# Patient Record
Sex: Female | Born: 1965 | Race: White | Hispanic: No | Smoking: Never smoker
Health system: Southern US, Community
[De-identification: ages and names within clinical notes are randomized; demographics above are authoritative.]

## PROBLEM LIST (undated history)

## (undated) DIAGNOSIS — F32A Depression, unspecified: Secondary | ICD-10-CM

## (undated) DIAGNOSIS — IMO0002 Reserved for concepts with insufficient information to code with codable children: Secondary | ICD-10-CM

## (undated) DIAGNOSIS — F419 Anxiety disorder, unspecified: Secondary | ICD-10-CM

## (undated) DIAGNOSIS — R87619 Unspecified abnormal cytological findings in specimens from cervix uteri: Secondary | ICD-10-CM

## (undated) DIAGNOSIS — F329 Major depressive disorder, single episode, unspecified: Secondary | ICD-10-CM

## (undated) DIAGNOSIS — O09529 Supervision of elderly multigravida, unspecified trimester: Secondary | ICD-10-CM

## (undated) HISTORY — DX: Anxiety disorder, unspecified: F41.9

## (undated) HISTORY — DX: Depression, unspecified: F32.A

## (undated) HISTORY — PX: DILATION AND CURETTAGE OF UTERUS: SHX78

## (undated) HISTORY — PX: COLPOSCOPY: SHX161

## (undated) HISTORY — DX: Reserved for concepts with insufficient information to code with codable children: IMO0002

## (undated) HISTORY — DX: Unspecified abnormal cytological findings in specimens from cervix uteri: R87.619

## (undated) HISTORY — DX: Major depressive disorder, single episode, unspecified: F32.9

## (undated) HISTORY — DX: Supervision of elderly multigravida, unspecified trimester: O09.529

---

## 1998-04-05 ENCOUNTER — Ambulatory Visit (HOSPITAL_COMMUNITY): Admission: AD | Admit: 1998-04-05 | Discharge: 1998-04-05 | Payer: Self-pay | Admitting: Obstetrics and Gynecology

## 1998-04-05 ENCOUNTER — Encounter: Payer: Self-pay | Admitting: Obstetrics and Gynecology

## 1999-01-15 ENCOUNTER — Other Ambulatory Visit: Admission: RE | Admit: 1999-01-15 | Discharge: 1999-01-15 | Payer: Self-pay | Admitting: *Deleted

## 1999-05-30 ENCOUNTER — Inpatient Hospital Stay (HOSPITAL_COMMUNITY): Admission: AD | Admit: 1999-05-30 | Discharge: 1999-05-30 | Payer: Self-pay | Admitting: Obstetrics and Gynecology

## 1999-08-06 ENCOUNTER — Inpatient Hospital Stay (HOSPITAL_COMMUNITY): Admission: AD | Admit: 1999-08-06 | Discharge: 1999-08-09 | Payer: Self-pay | Admitting: Pediatrics

## 1999-09-18 ENCOUNTER — Other Ambulatory Visit: Admission: RE | Admit: 1999-09-18 | Discharge: 1999-09-18 | Payer: Self-pay | Admitting: Obstetrics and Gynecology

## 2000-03-24 ENCOUNTER — Other Ambulatory Visit: Admission: RE | Admit: 2000-03-24 | Discharge: 2000-03-24 | Payer: Self-pay | Admitting: Obstetrics and Gynecology

## 2000-09-23 ENCOUNTER — Other Ambulatory Visit: Admission: RE | Admit: 2000-09-23 | Discharge: 2000-09-23 | Payer: Self-pay | Admitting: Obstetrics and Gynecology

## 2002-09-12 ENCOUNTER — Other Ambulatory Visit: Admission: RE | Admit: 2002-09-12 | Discharge: 2002-09-12 | Payer: Self-pay | Admitting: Obstetrics and Gynecology

## 2004-04-23 ENCOUNTER — Other Ambulatory Visit: Admission: RE | Admit: 2004-04-23 | Discharge: 2004-04-23 | Payer: Self-pay | Admitting: Obstetrics and Gynecology

## 2005-07-12 ENCOUNTER — Other Ambulatory Visit: Admission: RE | Admit: 2005-07-12 | Discharge: 2005-07-12 | Payer: Self-pay | Admitting: Obstetrics and Gynecology

## 2006-08-16 HISTORY — PX: BREAST SURGERY: SHX581

## 2009-08-24 ENCOUNTER — Emergency Department (HOSPITAL_BASED_OUTPATIENT_CLINIC_OR_DEPARTMENT_OTHER): Admission: EM | Admit: 2009-08-24 | Discharge: 2009-08-24 | Payer: Self-pay | Admitting: Emergency Medicine

## 2010-01-20 ENCOUNTER — Ambulatory Visit: Payer: Self-pay | Admitting: Oncology

## 2010-01-21 LAB — CBC WITH DIFFERENTIAL/PLATELET
BASO%: 0.4 % (ref 0.0–2.0)
Basophils Absolute: 0 10*3/uL (ref 0.0–0.1)
EOS%: 0.5 % (ref 0.0–7.0)
Eosinophils Absolute: 0 10*3/uL (ref 0.0–0.5)
HCT: 37 % (ref 34.8–46.6)
HGB: 13.1 g/dL (ref 11.6–15.9)
LYMPH%: 14.1 % (ref 14.0–49.7)
MCH: 32.4 pg (ref 25.1–34.0)
MCHC: 35.3 g/dL (ref 31.5–36.0)
MCV: 91.8 fL (ref 79.5–101.0)
MONO#: 0.4 10*3/uL (ref 0.1–0.9)
MONO%: 6 % (ref 0.0–14.0)
NEUT#: 5 10*3/uL (ref 1.5–6.5)
NEUT%: 79 % — ABNORMAL HIGH (ref 38.4–76.8)
Platelets: 223 10*3/uL (ref 145–400)
RBC: 4.03 10*6/uL (ref 3.70–5.45)
RDW: 12.1 % (ref 11.2–14.5)
WBC: 6.4 10*3/uL (ref 3.9–10.3)
lymph#: 0.9 10*3/uL (ref 0.9–3.3)

## 2010-01-26 LAB — HYPERCOAGULABLE PANEL, COMPREHENSIVE
AntiThromb III Func: 112 % (ref 76–126)
Anticardiolipin IgA: 9 APL U/mL (ref <22)
Anticardiolipin IgG: 10 GPL U/mL (ref <23)
Anticardiolipin IgM: 5 MPL U/mL (ref <11)
Beta-2 Glyco I IgG: 0 G Units (ref <20)
Beta-2-Glycoprotein I IgA: 8 A Units (ref <20)
Beta-2-Glycoprotein I IgM: 18 M Units (ref <20)
DRVVT: 38 secs (ref 36.2–44.3)
Homocysteine: 8 umol/L (ref 4.0–15.4)
Lupus Anticoagulant: NOT DETECTED
PTT Lupus Anticoagulant: 45.1 secs — ABNORMAL HIGH (ref 32.0–43.4)
PTTLA 4:1 Mix: 39.9 secs (ref 36.3–48.8)
Protein C Activity: 100 % (ref 75–133)
Protein C, Total: 68 % — ABNORMAL LOW (ref 70–140)
Protein S Activity: 102 % (ref 69–129)
Protein S Ag, Total: 95 % (ref 70–140)

## 2010-01-26 LAB — ANA: Anti Nuclear Antibody(ANA): NEGATIVE

## 2010-01-26 LAB — COMPREHENSIVE METABOLIC PANEL
ALT: 15 U/L (ref 0–35)
AST: 17 U/L (ref 0–37)
Albumin: 4.7 g/dL (ref 3.5–5.2)
Alkaline Phosphatase: 32 U/L — ABNORMAL LOW (ref 39–117)
BUN: 16 mg/dL (ref 6–23)
CO2: 25 mEq/L (ref 19–32)
Calcium: 9.5 mg/dL (ref 8.4–10.5)
Chloride: 102 mEq/L (ref 96–112)
Creatinine, Ser: 0.72 mg/dL (ref 0.40–1.20)
Glucose, Bld: 90 mg/dL (ref 70–99)
Potassium: 3.8 mEq/L (ref 3.5–5.3)
Sodium: 138 mEq/L (ref 135–145)
Total Bilirubin: 0.7 mg/dL (ref 0.3–1.2)
Total Protein: 7.7 g/dL (ref 6.0–8.3)

## 2010-01-26 LAB — SEDIMENTATION RATE: Sed Rate: 14 mm/hr (ref 0–22)

## 2010-02-08 ENCOUNTER — Ambulatory Visit: Payer: Self-pay | Admitting: Nurse Practitioner

## 2010-02-08 ENCOUNTER — Inpatient Hospital Stay (HOSPITAL_COMMUNITY): Admission: AD | Admit: 2010-02-08 | Discharge: 2010-02-08 | Payer: Self-pay | Admitting: Obstetrics and Gynecology

## 2011-03-09 IMAGING — US US OB TRANSVAGINAL MODIFY
1 series · 14 of 28 positions shown · non-contrast
Comparison: Head

CLINICAL DATA: Vaginal bleeding and cramping.

OBSTETRIC <14 WK US AND TRANSVAGINAL OB US
TECHNIQUE: Both transabdominal and transvaginal ultrasound
examinations were performed for complete evaluation of the
gestation as well as the maternal uterus, adnexal regions, and
pelvic cul-de-sac.  Transvaginal technique was performed to assess
early pregnancy.

[Series 1: us ob comp less 14 wks · 0.21mm/px · 14 of 38 slices shown]
[im 2/38]
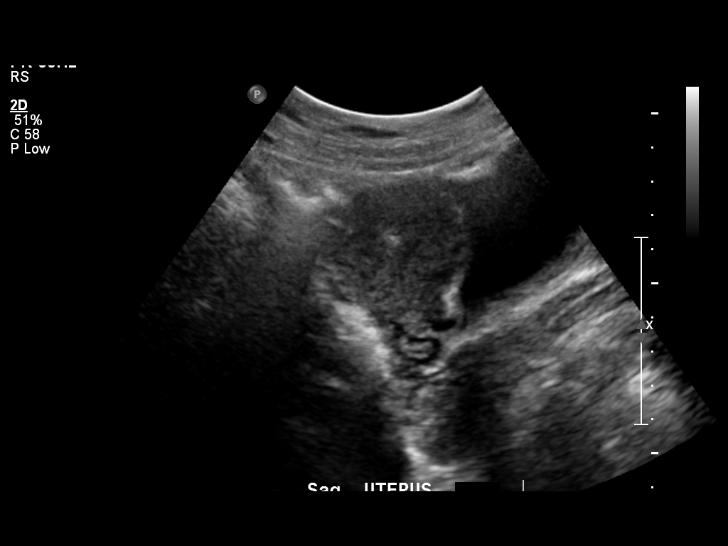
[im 5/38]
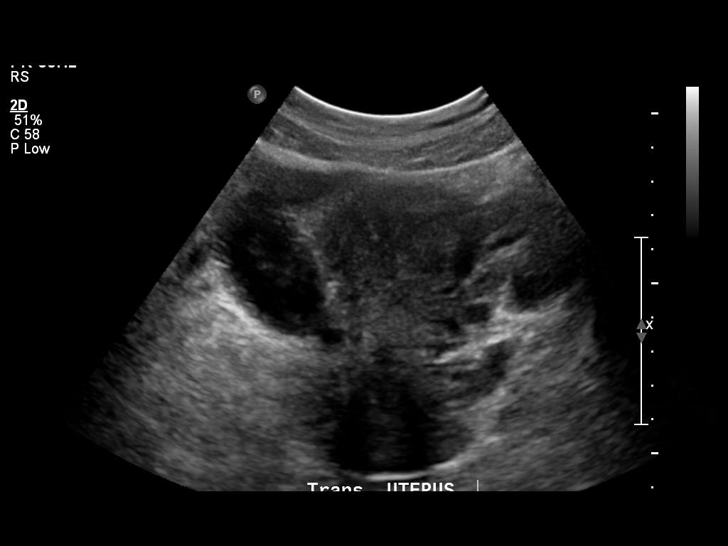
[im 7/38]
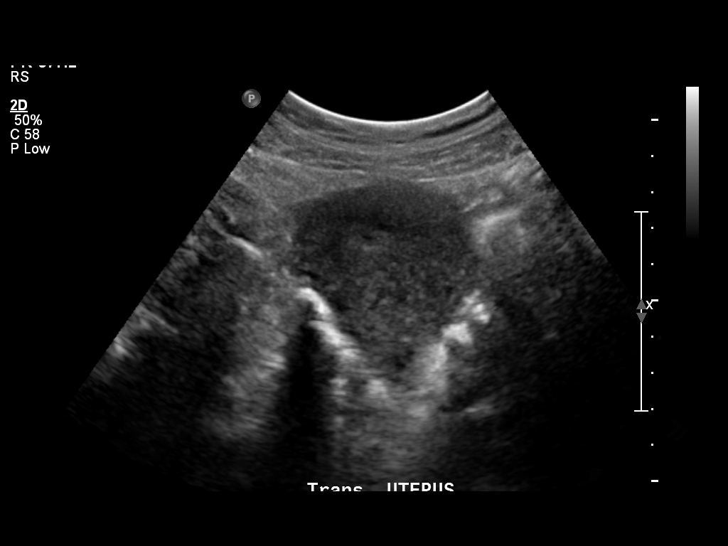
[im 10/38]
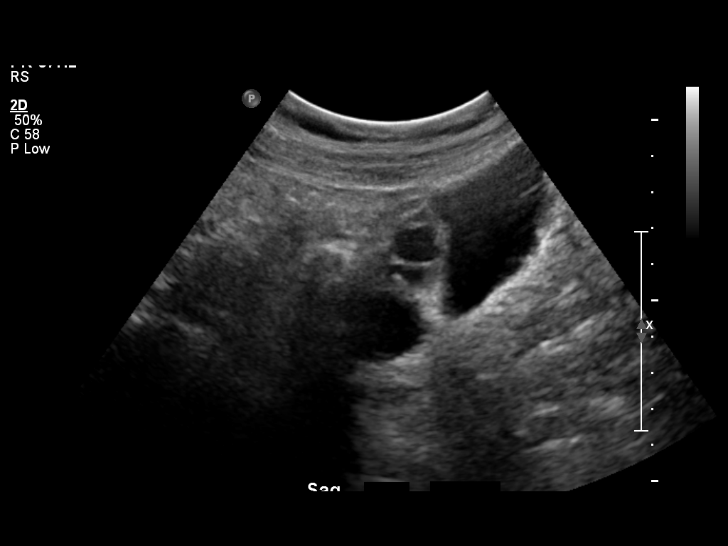
[im 13/38]
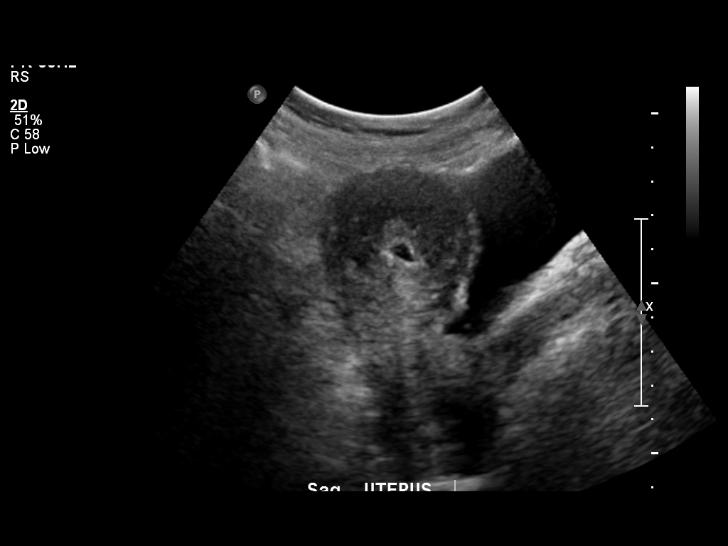
[im 16/38]
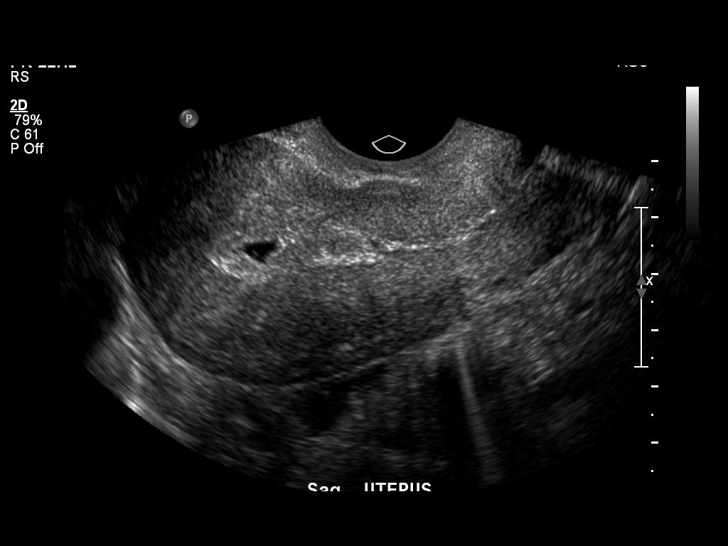
[im 18/38]
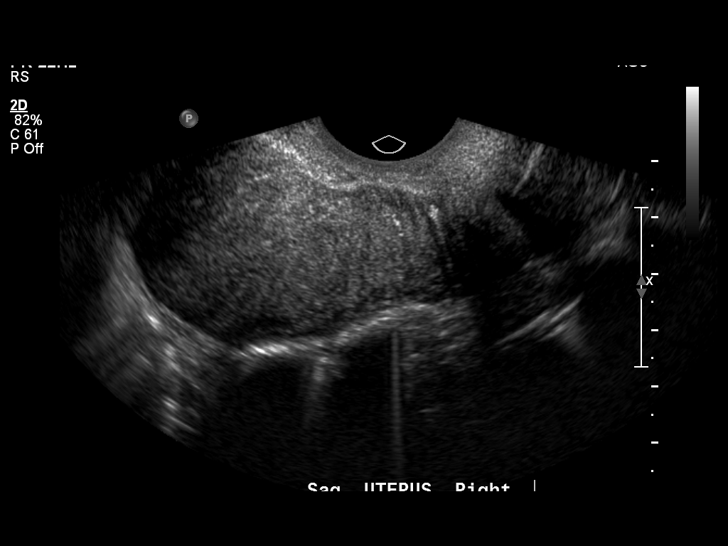
[im 21/38]
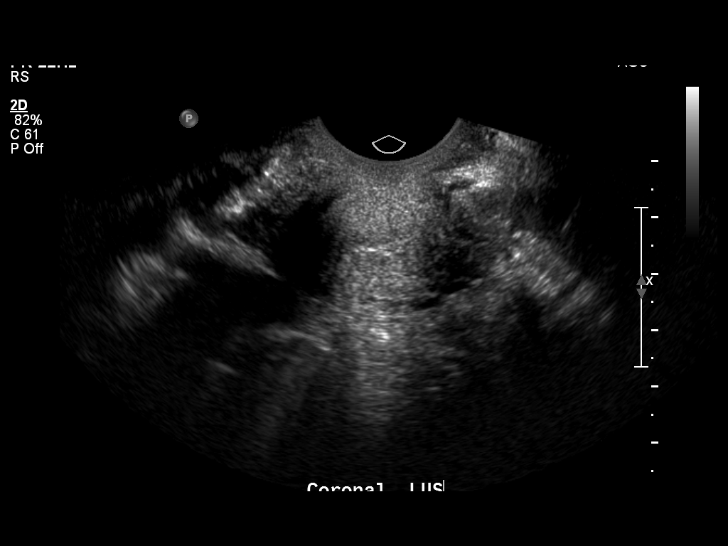
[im 24/38]
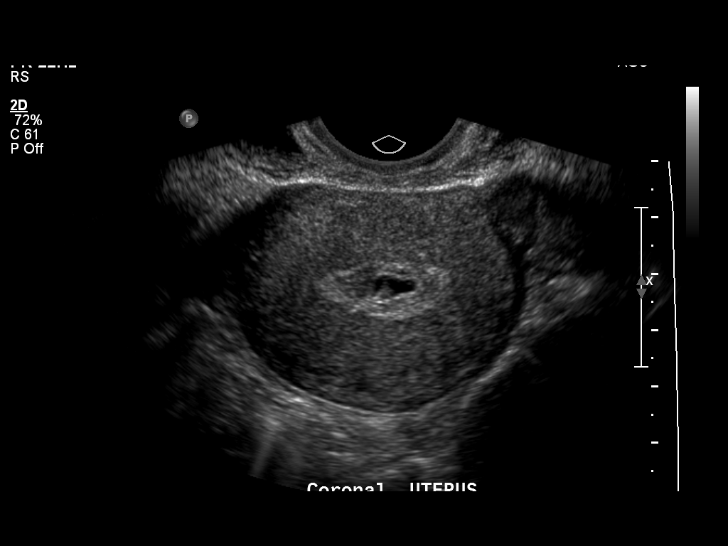
[im 27/38]
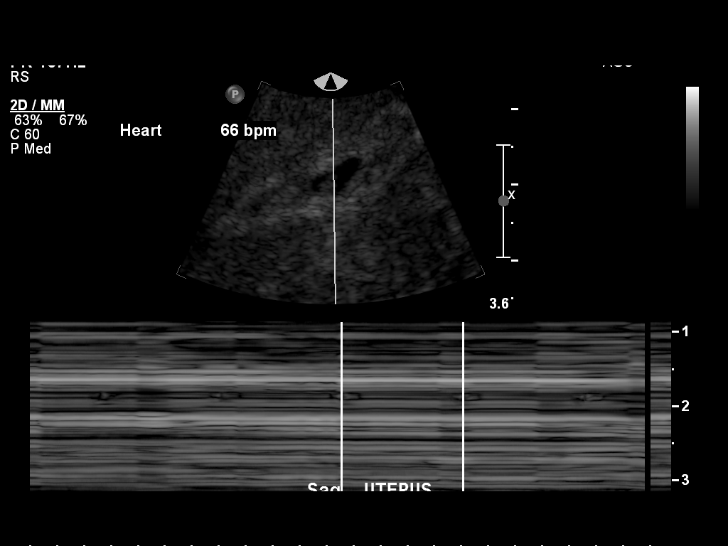
[im 29/38]
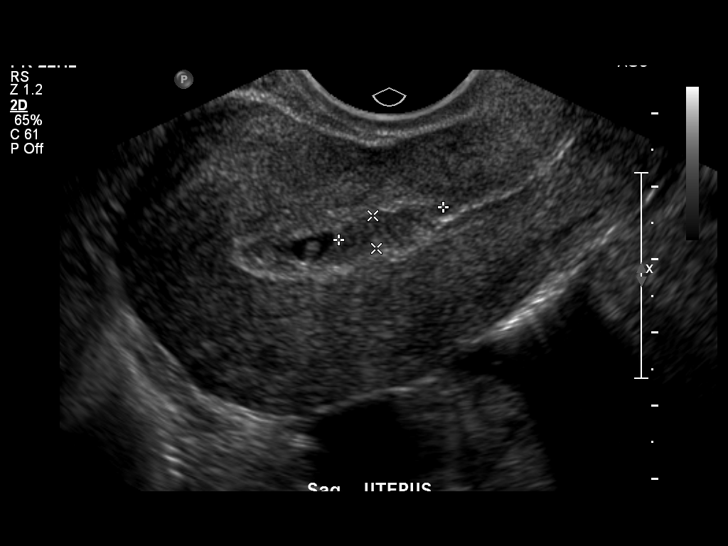
[im 32/38]
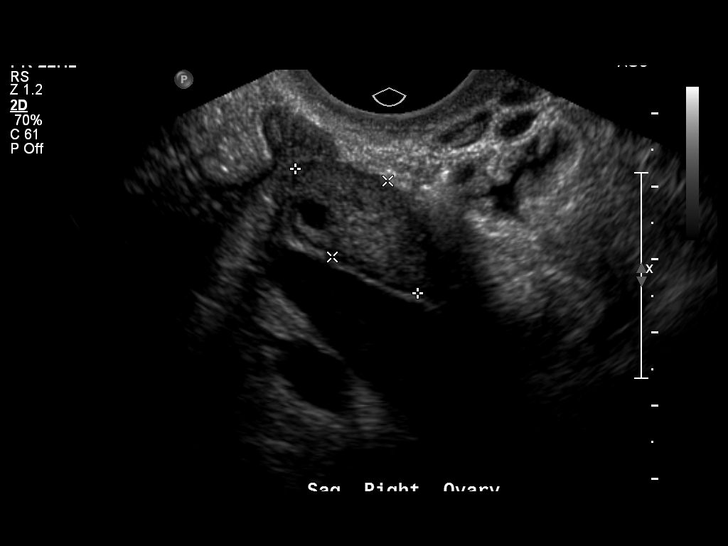
[im 35/38]
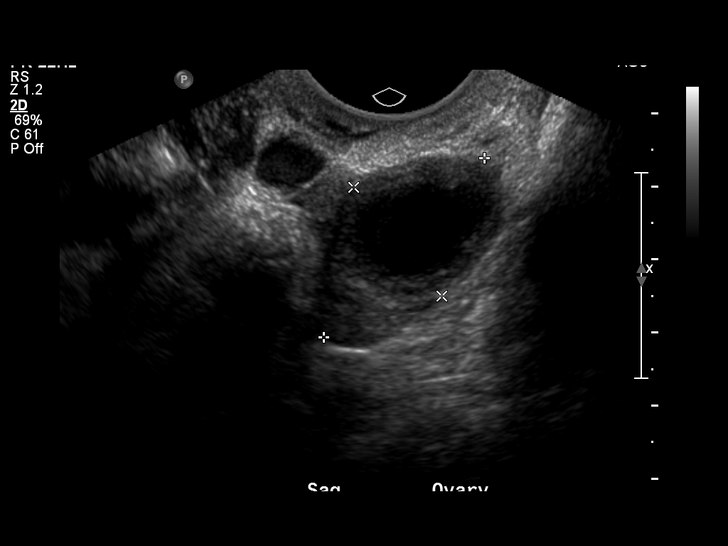
[im 38/38]
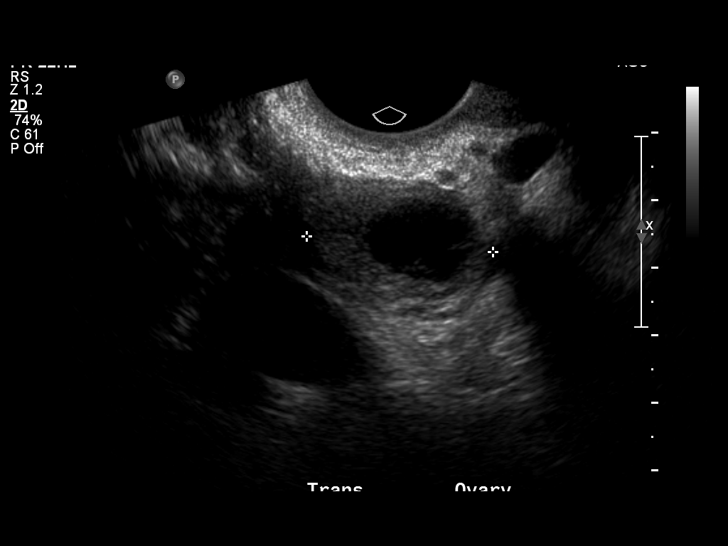

[14 of 28 positions shown; findings below may reference images not displayed]

Intrauterine gestational sac:  There is a single irregular mobile
gestational sac with no decidual reaction.
Yolk sac: No
Embryo: Yes
Cardiac Activity: Yes
Heart Rate: 66 bpm

CRL: 3.1 and   mm  6   w  0   d

Maternal uterus/adnexae:
The right ovary is normal.  There is a corpus lutein cyst on the
otherwise normal left ovary.

There is no free fluid.
IMPRESSION: Single intrauterine pregnancy of approximately 6 weeks 0 days
gestation.  Deformed gestational sac with no significant decidual
reaction.

## 2011-05-07 ENCOUNTER — Other Ambulatory Visit (HOSPITAL_COMMUNITY): Payer: Self-pay | Admitting: Gynecology

## 2011-05-07 DIAGNOSIS — N979 Female infertility, unspecified: Secondary | ICD-10-CM

## 2011-05-13 ENCOUNTER — Ambulatory Visit (HOSPITAL_COMMUNITY)
Admission: RE | Admit: 2011-05-13 | Discharge: 2011-05-13 | Disposition: A | Discharge status: Home / Self Care | Payer: BC Managed Care – PPO | Source: Ambulatory Visit | Attending: Gynecology | Admitting: Gynecology

## 2011-05-13 DIAGNOSIS — N979 Female infertility, unspecified: Secondary | ICD-10-CM

## 2011-05-13 MED ORDER — IOHEXOL 300 MG/ML  SOLN: 10.0000 mL | Freq: Once | INTRAMUSCULAR | Status: AC | PRN

## 2012-08-16 NOTE — L&D Delivery Note (Signed)
Delivery Note At 9:15 PM a viable female was delivered via Vaginal, Spontaneous Delivery (Presentation:OA ;  ).  APGAR: , ; weight .   Placenta status:spont>>>intact   , .  Cord:  with the following complications: .  Cord pH: sent  Anesthesia:  epid Episiotomy: none Lacerations: first deg Suture Repair: 3.0 vicryl rapide Est. Blood Loss (mL): 300  Mom to postpartum.  Baby to nursery-stable.  Meriel Pica 04/10/2013, 9:28 PM

## 2012-09-13 LAB — OB RESULTS CONSOLE ANTIBODY SCREEN: Antibody Screen: NEGATIVE

## 2012-09-13 LAB — OB RESULTS CONSOLE GC/CHLAMYDIA
Chlamydia: NEGATIVE
Gonorrhea: NEGATIVE

## 2012-09-13 LAB — OB RESULTS CONSOLE RUBELLA ANTIBODY, IGM: Rubella: IMMUNE

## 2012-09-13 LAB — OB RESULTS CONSOLE RPR: RPR: NONREACTIVE

## 2012-09-13 LAB — OB RESULTS CONSOLE HEPATITIS B SURFACE ANTIGEN: Hepatitis B Surface Ag: NEGATIVE

## 2012-10-26 ENCOUNTER — Other Ambulatory Visit: Payer: Self-pay

## 2012-10-31 ENCOUNTER — Other Ambulatory Visit (HOSPITAL_COMMUNITY): Payer: Self-pay | Admitting: Obstetrics and Gynecology

## 2012-11-07 ENCOUNTER — Ambulatory Visit (HOSPITAL_COMMUNITY)
Admission: RE | Admit: 2012-11-07 | Discharge: 2012-11-07 | Disposition: A | Discharge status: Home / Self Care | Payer: BC Managed Care – PPO | Source: Ambulatory Visit | Attending: Obstetrics and Gynecology | Admitting: Obstetrics and Gynecology

## 2012-11-07 ENCOUNTER — Encounter (HOSPITAL_COMMUNITY): Payer: Self-pay

## 2012-11-07 VITALS — BP 112/69 | HR 69 | Wt 150.0 lb

## 2012-11-07 DIAGNOSIS — Z8751 Personal history of pre-term labor: Secondary | ICD-10-CM | POA: Insufficient documentation

## 2012-11-07 DIAGNOSIS — O358XX0 Maternal care for other (suspected) fetal abnormality and damage, not applicable or unspecified: Secondary | ICD-10-CM | POA: Insufficient documentation

## 2012-11-07 DIAGNOSIS — O2620 Pregnancy care for patient with recurrent pregnancy loss, unspecified trimester: Secondary | ICD-10-CM | POA: Insufficient documentation

## 2012-11-07 DIAGNOSIS — Z1389 Encounter for screening for other disorder: Secondary | ICD-10-CM | POA: Insufficient documentation

## 2012-11-07 DIAGNOSIS — Z3689 Encounter for other specified antenatal screening: Secondary | ICD-10-CM

## 2012-11-07 DIAGNOSIS — O09529 Supervision of elderly multigravida, unspecified trimester: Secondary | ICD-10-CM | POA: Insufficient documentation

## 2012-11-07 DIAGNOSIS — O352XX0 Maternal care for (suspected) hereditary disease in fetus, not applicable or unspecified: Secondary | ICD-10-CM | POA: Insufficient documentation

## 2012-11-07 DIAGNOSIS — O09522 Supervision of elderly multigravida, second trimester: Secondary | ICD-10-CM

## 2012-11-07 DIAGNOSIS — O34219 Maternal care for unspecified type scar from previous cesarean delivery: Secondary | ICD-10-CM | POA: Insufficient documentation

## 2012-11-07 DIAGNOSIS — Z363 Encounter for antenatal screening for malformations: Secondary | ICD-10-CM | POA: Insufficient documentation

## 2012-11-07 NOTE — Progress Notes (Signed)
Deanna Martinez  was seen today for an ultrasound appointment.  See full report in [facility].  Comments: [name] was seen today due to advanced maternal age.  See separate note from Runner, broadcasting/film/video.  The patient previously underwent NIPT (cell free fetal DNA) that was low risk for aneuploidy.  She also reports that a previous child was diagnosed with a subtle congenital heart defect.  The couple was offerred and declined a fetal echo wtih Peds cardiology.  Impression: Single IUP at 17 0/7 weeks Normal detailed fetal anatomy No markers associated with aneuploidy noted Normal amniotic fluid volume  Recommendations: Recommend follow-up ultrasound examination in 6 weeks.  Alpha Gula, MD

## 2012-11-09 ENCOUNTER — Other Ambulatory Visit (HOSPITAL_COMMUNITY): Payer: BC Managed Care – PPO

## 2012-11-09 ENCOUNTER — Encounter (HOSPITAL_COMMUNITY): Payer: BC Managed Care – PPO

## 2012-12-19 ENCOUNTER — Ambulatory Visit (HOSPITAL_COMMUNITY): Payer: BC Managed Care – PPO

## 2013-03-20 LAB — OB RESULTS CONSOLE GBS: GBS: NEGATIVE

## 2013-03-27 ENCOUNTER — Encounter (HOSPITAL_COMMUNITY): Payer: Self-pay | Admitting: *Deleted

## 2013-03-27 ENCOUNTER — Telehealth (HOSPITAL_COMMUNITY): Payer: Self-pay | Admitting: *Deleted

## 2013-03-27 NOTE — Telephone Encounter (Signed)
Preadmission screen  

## 2013-03-29 ENCOUNTER — Telehealth (HOSPITAL_COMMUNITY): Payer: Self-pay | Admitting: *Deleted

## 2013-03-29 ENCOUNTER — Encounter (HOSPITAL_COMMUNITY): Payer: Self-pay | Admitting: *Deleted

## 2013-03-29 NOTE — Telephone Encounter (Signed)
Preadmission screen  

## 2013-04-09 ENCOUNTER — Inpatient Hospital Stay (HOSPITAL_COMMUNITY)
Admission: RE | Admit: 2013-04-09 | Discharge: 2013-04-12 | DRG: 373 | Disposition: A | Discharge status: Home / Self Care | Payer: BC Managed Care – PPO | Source: Ambulatory Visit | Attending: Obstetrics and Gynecology | Admitting: Obstetrics and Gynecology

## 2013-04-09 ENCOUNTER — Encounter (HOSPITAL_COMMUNITY): Payer: Self-pay

## 2013-04-09 DIAGNOSIS — O34219 Maternal care for unspecified type scar from previous cesarean delivery: Secondary | ICD-10-CM | POA: Diagnosis present

## 2013-04-09 DIAGNOSIS — O09529 Supervision of elderly multigravida, unspecified trimester: Secondary | ICD-10-CM | POA: Diagnosis present

## 2013-04-09 LAB — TYPE AND SCREEN
ABO/RH(D): O POS
Antibody Screen: NEGATIVE

## 2013-04-09 LAB — CBC
HCT: 33.2 % — ABNORMAL LOW (ref 36.0–46.0)
Hemoglobin: 11.8 g/dL — ABNORMAL LOW (ref 12.0–15.0)
RBC: 3.68 MIL/uL — ABNORMAL LOW (ref 3.87–5.11)
WBC: 9.4 10*3/uL (ref 4.0–10.5)

## 2013-04-09 LAB — ABO/RH: ABO/RH(D): O POS

## 2013-04-09 MED ORDER — OXYTOCIN 40 UNITS IN LACTATED RINGERS INFUSION - SIMPLE MED
62.5000 mL/h | INTRAVENOUS | Status: DC
  Administered 2013-04-10: 62.5 mL/h via INTRAVENOUS
  Filled 2013-04-09: qty 1000

## 2013-04-09 MED ORDER — OXYCODONE-ACETAMINOPHEN 5-325 MG PO TABS: 1.0000 | ORAL_TABLET | ORAL | Status: DC | PRN

## 2013-04-09 MED ORDER — PROMETHAZINE HCL 25 MG/ML IJ SOLN
12.5000 mg | Freq: Four times a day (QID) | INTRAMUSCULAR | Status: DC | PRN
  Administered 2013-04-10: 12.5 mg via INTRAVENOUS
  Filled 2013-04-09: qty 1

## 2013-04-09 MED ORDER — IBUPROFEN 600 MG PO TABS: 600.0000 mg | ORAL_TABLET | Freq: Four times a day (QID) | ORAL | Status: DC | PRN

## 2013-04-09 MED ORDER — BUTORPHANOL TARTRATE 1 MG/ML IJ SOLN: 1.0000 mg | INTRAMUSCULAR | Status: DC | PRN

## 2013-04-09 MED ORDER — OXYTOCIN BOLUS FROM INFUSION: 500.0000 mL | INTRAVENOUS | Status: DC

## 2013-04-09 MED ORDER — LACTATED RINGERS IV SOLN
INTRAVENOUS | Status: DC
  Administered 2013-04-10 (×3): via INTRAVENOUS

## 2013-04-09 MED ORDER — CITRIC ACID-SODIUM CITRATE 334-500 MG/5ML PO SOLN: 30.0000 mL | ORAL | Status: DC | PRN

## 2013-04-09 MED ORDER — ZOLPIDEM TARTRATE 5 MG PO TABS: 5.0000 mg | ORAL_TABLET | Freq: Every evening | ORAL | Status: DC | PRN

## 2013-04-09 MED ORDER — OXYTOCIN 40 UNITS IN LACTATED RINGERS INFUSION - SIMPLE MED
1.0000 m[IU]/min | INTRAVENOUS | Status: DC
  Administered 2013-04-10: 1 m[IU]/min via INTRAVENOUS
  Filled 2013-04-09: qty 1000

## 2013-04-09 MED ORDER — ONDANSETRON HCL 4 MG/2ML IJ SOLN: 4.0000 mg | Freq: Four times a day (QID) | INTRAMUSCULAR | Status: DC | PRN

## 2013-04-09 MED ORDER — ACETAMINOPHEN 325 MG PO TABS: 650.0000 mg | ORAL_TABLET | ORAL | Status: DC | PRN

## 2013-04-09 MED ORDER — LACTATED RINGERS IV SOLN: 500.0000 mL | INTRAVENOUS | Status: DC | PRN

## 2013-04-09 MED ORDER — LIDOCAINE HCL (PF) 1 % IJ SOLN
30.0000 mL | INTRAMUSCULAR | Status: DC | PRN
  Filled 2013-04-09 (×2): qty 30

## 2013-04-09 MED ORDER — TERBUTALINE SULFATE 1 MG/ML IJ SOLN: 0.2500 mg | Freq: Once | INTRAMUSCULAR | Status: AC | PRN

## 2013-04-10 ENCOUNTER — Encounter (HOSPITAL_COMMUNITY): Payer: Self-pay

## 2013-04-10 ENCOUNTER — Encounter (HOSPITAL_COMMUNITY): Payer: Self-pay | Admitting: Anesthesiology

## 2013-04-10 ENCOUNTER — Inpatient Hospital Stay (HOSPITAL_COMMUNITY): Payer: BC Managed Care – PPO | Admitting: Anesthesiology

## 2013-04-10 LAB — CBC
HCT: 31.3 % — ABNORMAL LOW (ref 36.0–46.0)
Hemoglobin: 11.2 g/dL — ABNORMAL LOW (ref 12.0–15.0)
Hemoglobin: 11.1 g/dL — ABNORMAL LOW (ref 12.0–15.0)
MCH: 32.4 pg (ref 26.0–34.0)
MCHC: 34.7 g/dL (ref 30.0–36.0)
Platelets: 141 10*3/uL — ABNORMAL LOW (ref 150–400)
RBC: 3.46 MIL/uL — ABNORMAL LOW (ref 3.87–5.11)
RBC: 3.45 MIL/uL — ABNORMAL LOW (ref 3.87–5.11)
WBC: 14.7 10*3/uL — ABNORMAL HIGH (ref 4.0–10.5)

## 2013-04-10 LAB — RPR: RPR Ser Ql: NONREACTIVE

## 2013-04-10 MED ORDER — PRENATAL MULTIVITAMIN CH
1.0000 | ORAL_TABLET | Freq: Every day | ORAL | Status: DC
  Administered 2013-04-11: 1 via ORAL
  Filled 2013-04-10: qty 1

## 2013-04-10 MED ORDER — FLEET ENEMA 7-19 GM/118ML RE ENEM: 1.0000 | ENEMA | Freq: Every day | RECTAL | Status: DC | PRN

## 2013-04-10 MED ORDER — BENZOCAINE-MENTHOL 20-0.5 % EX AERO
1.0000 "application " | INHALATION_SPRAY | CUTANEOUS | Status: DC | PRN
  Filled 2013-04-10: qty 56

## 2013-04-10 MED ORDER — LACTATED RINGERS IV SOLN
500.0000 mL | Freq: Once | INTRAVENOUS | Status: AC
  Administered 2013-04-10: 500 mL via INTRAVENOUS

## 2013-04-10 MED ORDER — EPHEDRINE 5 MG/ML INJ
10.0000 mg | INTRAVENOUS | Status: DC | PRN
  Administered 2013-04-10: 5 mg via INTRAVENOUS
  Filled 2013-04-10: qty 2

## 2013-04-10 MED ORDER — LANOLIN HYDROUS EX OINT: TOPICAL_OINTMENT | CUTANEOUS | Status: DC | PRN

## 2013-04-10 MED ORDER — OXYTOCIN 40 UNITS IN LACTATED RINGERS INFUSION - SIMPLE MED: 62.5000 mL/h | INTRAVENOUS | Status: DC

## 2013-04-10 MED ORDER — IBUPROFEN 800 MG PO TABS
800.0000 mg | ORAL_TABLET | Freq: Three times a day (TID) | ORAL | Status: DC | PRN
  Administered 2013-04-10 – 2013-04-12 (×4): 800 mg via ORAL
  Filled 2013-04-10 (×4): qty 1

## 2013-04-10 MED ORDER — TETANUS-DIPHTH-ACELL PERTUSSIS 5-2.5-18.5 LF-MCG/0.5 IM SUSP: 0.5000 mL | Freq: Once | INTRAMUSCULAR | Status: DC

## 2013-04-10 MED ORDER — SIMETHICONE 80 MG PO CHEW: 80.0000 mg | CHEWABLE_TABLET | ORAL | Status: DC | PRN

## 2013-04-10 MED ORDER — DIBUCAINE 1 % RE OINT: 1.0000 "application " | TOPICAL_OINTMENT | RECTAL | Status: DC | PRN

## 2013-04-10 MED ORDER — OXYCODONE-ACETAMINOPHEN 5-325 MG PO TABS
1.0000 | ORAL_TABLET | Freq: Four times a day (QID) | ORAL | Status: DC | PRN
  Administered 2013-04-11 (×2): 1 via ORAL
  Filled 2013-04-10 (×3): qty 1

## 2013-04-10 MED ORDER — ZOLPIDEM TARTRATE 5 MG PO TABS: 5.0000 mg | ORAL_TABLET | Freq: Every evening | ORAL | Status: DC | PRN

## 2013-04-10 MED ORDER — MEASLES, MUMPS & RUBELLA VAC ~~LOC~~ INJ
0.5000 mL | INJECTION | Freq: Once | SUBCUTANEOUS | Status: DC
  Filled 2013-04-10: qty 0.5

## 2013-04-10 MED ORDER — ONDANSETRON HCL 4 MG PO TABS: 4.0000 mg | ORAL_TABLET | ORAL | Status: DC | PRN

## 2013-04-10 MED ORDER — DIPHENHYDRAMINE HCL 50 MG/ML IJ SOLN: 12.5000 mg | INTRAMUSCULAR | Status: DC | PRN

## 2013-04-10 MED ORDER — SENNOSIDES-DOCUSATE SODIUM 8.6-50 MG PO TABS
2.0000 | ORAL_TABLET | Freq: Every day | ORAL | Status: DC
  Administered 2013-04-11: 2 via ORAL

## 2013-04-10 MED ORDER — PHENYLEPHRINE 40 MCG/ML (10ML) SYRINGE FOR IV PUSH (FOR BLOOD PRESSURE SUPPORT)
80.0000 ug | PREFILLED_SYRINGE | INTRAVENOUS | Status: DC | PRN
  Filled 2013-04-10: qty 2
  Filled 2013-04-10: qty 5

## 2013-04-10 MED ORDER — CITALOPRAM HYDROBROMIDE 40 MG PO TABS
40.0000 mg | ORAL_TABLET | Freq: Every day | ORAL | Status: DC
  Administered 2013-04-11 – 2013-04-12 (×2): 40 mg via ORAL
  Filled 2013-04-10 (×2): qty 1

## 2013-04-10 MED ORDER — METHYLERGONOVINE MALEATE 0.2 MG/ML IJ SOLN
INTRAMUSCULAR | Status: AC
  Filled 2013-04-10: qty 1

## 2013-04-10 MED ORDER — PHENYLEPHRINE 40 MCG/ML (10ML) SYRINGE FOR IV PUSH (FOR BLOOD PRESSURE SUPPORT)
80.0000 ug | PREFILLED_SYRINGE | INTRAVENOUS | Status: DC | PRN
  Filled 2013-04-10: qty 2

## 2013-04-10 MED ORDER — BISACODYL 10 MG RE SUPP: 10.0000 mg | Freq: Every day | RECTAL | Status: DC | PRN

## 2013-04-10 MED ORDER — ONDANSETRON HCL 4 MG/2ML IJ SOLN: 4.0000 mg | INTRAMUSCULAR | Status: DC | PRN

## 2013-04-10 MED ORDER — EPHEDRINE 5 MG/ML INJ
10.0000 mg | INTRAVENOUS | Status: DC | PRN
  Filled 2013-04-10: qty 4
  Filled 2013-04-10: qty 2

## 2013-04-10 MED ORDER — MISOPROSTOL 200 MCG PO TABS
ORAL_TABLET | ORAL | Status: AC
  Filled 2013-04-10: qty 5

## 2013-04-10 MED ORDER — WITCH HAZEL-GLYCERIN EX PADS: 1.0000 "application " | MEDICATED_PAD | CUTANEOUS | Status: DC | PRN

## 2013-04-10 MED ORDER — DIPHENHYDRAMINE HCL 25 MG PO CAPS: 25.0000 mg | ORAL_CAPSULE | Freq: Four times a day (QID) | ORAL | Status: DC | PRN

## 2013-04-10 MED ORDER — FENTANYL 2.5 MCG/ML BUPIVACAINE 1/10 % EPIDURAL INFUSION (WH - ANES)
14.0000 mL/h | INTRAMUSCULAR | Status: DC | PRN
  Administered 2013-04-10: 14 mL/h via EPIDURAL
  Filled 2013-04-10 (×2): qty 125

## 2013-04-10 NOTE — H&P (Signed)
Deanna Martinez  DICTATION # [id number] CSN# [id number]   Meriel Pica, MD 04/10/2013 10:00 AM

## 2013-04-10 NOTE — Anesthesia Preprocedure Evaluation (Signed)
Anesthesia Evaluation  Patient identified by MRN, date of birth, ID band Patient awake    Reviewed: Allergy & Precautions, H&P , NPO status , Patient's Chart, lab work & pertinent test results  Airway Mallampati: I TM Distance: >3 FB Neck ROM: full    Dental no notable dental hx.    Pulmonary neg pulmonary ROS,    Pulmonary exam normal       Cardiovascular negative cardio ROS      Neuro/Psych negative neurological ROS     GI/Hepatic negative GI ROS, Neg liver ROS,   Endo/Other  negative endocrine ROS  Renal/GU negative Renal ROS  negative genitourinary   Musculoskeletal negative musculoskeletal ROS (+)   Abdominal Normal abdominal exam  (+)   Peds  Hematology negative hematology ROS (+)   Anesthesia Other Findings   Reproductive/Obstetrics (+) Pregnancy                           Anesthesia Physical Anesthesia Plan  ASA: II  Anesthesia Plan: Epidural   Post-op Pain Management:    Induction:   Airway Management Planned:   Additional Equipment:   Intra-op Plan:   Post-operative Plan:   Informed Consent: I have reviewed the patients History and Physical, chart, labs and discussed the procedure including the risks, benefits and alternatives for the proposed anesthesia with the patient or authorized representative who has indicated his/her understanding and acceptance.     Plan Discussed with:   Anesthesia Plan Comments:         Anesthesia Quick Evaluation  

## 2013-04-10 NOTE — Progress Notes (Signed)
Now 1-2/30/vtx/  AROM with ISE>>clr AF, will cont LD pitocin

## 2013-04-10 NOTE — H&P (Signed)
Deanna Martinez, Deanna Martinez                   ACCOUNT NO.:  [id number]  MEDICAL RECORD NO.:  [id number]  LOCATION:                                 FACILITY:  PHYSICIAN:  Duke Salvia. Marcelle Overlie, M.D.DATE OF BIRTH:  [birth year]  DATE OF ADMISSION:  04/09/2013 DATE OF DISCHARGE:                             HISTORY & PHYSICAL   CHIEF COMPLAINT:  For labor induction at term.  HISTORY OF PRESENT ILLNESS:  A 47 year old, G11, P 2-2-6-4, EDD September 2, patient presents for a two-stage induction at 39+ weeks. This patient had initial maternity 21 screening that was normal, followed by AFP only that was normal.  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  She has been on a baby aspirin and progesterone supplements in the 1st trimester.  Her cesarean section was a low transverse cesarean section and she prefers for a VBAC, risks related to VBAC discussed and she did sign informed consent.  GBS was negative.  PAST MEDICAL HISTORY:  Allergies none.  For the remainder of her past medical history, please see the [facility] form.  PHYSICAL EXAMINATION:  VITAL SIGNS:  Temp 98.2, blood pressure 118/74. HEENT:  Unremarkable. NECK:  Supple without masses. LUNGS:  Clear. CARDIOVASCULAR:  Regular rate and rhythm without murmurs, rubs, gallops. BREASTS:  Implants without masses. PELVIC:  Term fundal height.  Fetal heart rate 140, cervix was 1, 50%, vertex. EXTREMITIES/NEUROLOGIC:  Unremarkable.  IMPRESSION:  Term pregnancy, history of prior low transverse cesarean section, patient prefers vaginal birth after cesarean.  PLAN:  VBAC procedure and risks outlined, admit for two-stage induction.     Deanna Martinez M. Marcelle Overlie, M.D.     RMH/MEDQ  D:  04/10/2013  T:  04/10/2013  Job:  [id number]

## 2013-04-10 NOTE — Progress Notes (Signed)
Now has epid, pit @ 16 mu/min, cx 7-8 cm, stable FHR

## 2013-04-11 LAB — CBC
HCT: 28.7 % — ABNORMAL LOW (ref 36.0–46.0)
Hemoglobin: 10.1 g/dL — ABNORMAL LOW (ref 12.0–15.0)
MCV: 90.8 fL (ref 78.0–100.0)
Platelets: 131 10*3/uL — ABNORMAL LOW (ref 150–400)
RBC: 3.16 MIL/uL — ABNORMAL LOW (ref 3.87–5.11)
WBC: 11.9 10*3/uL — ABNORMAL HIGH (ref 4.0–10.5)

## 2013-04-11 MED ORDER — FENTANYL 2.5 MCG/ML BUPIVACAINE 1/10 % EPIDURAL INFUSION (WH - ANES)
INTRAMUSCULAR | Status: DC | PRN
  Administered 2013-04-10: 14 mL/h via EPIDURAL

## 2013-04-11 MED ORDER — LIDOCAINE HCL (PF) 1 % IJ SOLN
INTRAMUSCULAR | Status: DC | PRN
  Administered 2013-04-10 (×2): 9 mL

## 2013-04-11 MED ORDER — LIDOCAINE HCL (PF) 1 % IJ SOLN: INTRAMUSCULAR | Status: DC | PRN

## 2013-04-11 NOTE — Anesthesia Postprocedure Evaluation (Signed)
Anesthesia Post Note  Patient: Deanna Martinez  Procedure(s) Performed: * No procedures listed *  Anesthesia type: Epidural  Patient location: Mother/Baby  Post pain: Pain level controlled  Post assessment: Post-op Vital signs reviewed  Last Vitals:  Filed Vitals:   04/11/13 0500  BP: 113/72  Pulse: 67  Temp: 36.6 C  Resp: 16    Post vital signs: Reviewed  Level of consciousness: awake  Complications: No apparent anesthesia complications

## 2013-04-11 NOTE — Progress Notes (Signed)
Post Partum Day 1 Subjective: no complaints, up ad lib, voiding and tolerating PO  Objective: Blood pressure 113/72, pulse 67, temperature 97.9 F (36.6 C), temperature source Oral, resp. rate 16, height 5\' 6"  (1.676 m), weight 160 lb (72.576 kg), last menstrual period 07/11/2012, SpO2 100.00%, unknown if currently breastfeeding.  Physical Exam:  General: alert and cooperative Lochia: appropriate Uterine Fundus: firm Incision: perineum intact, small labial edema noted DVT Evaluation: No evidence of DVT seen on physical exam. Negative Homan's sign. No cords or calf tenderness. No significant calf/ankle edema.   Recent Labs  04/10/13 2305 04/11/13 0555  HGB 11.1* 10.1*  HCT 31.3* 28.7*    Assessment/Plan: Plan for discharge tomorrow   LOS: 2 days   Zakir Henner G 04/11/2013, 8:15 AM

## 2013-04-11 NOTE — Progress Notes (Signed)
Patient was referred for history of depression/anxiety. * Referral screened out by Clinical Social Worker because none of the following criteria appear to apply:  ~ History of anxiety/depression during this pregnancy, or of post-partum depression.  ~ Diagnosis of anxiety and/or depression within last 3 years  ~ History of depression due to pregnancy loss/loss of child  OR * Patient's symptoms currently being treated with medication (Celexa) and/or therapy.  Please contact the Clinical Social Worker if needs arise, or by the patient's request.  

## 2013-04-12 MED ORDER — IBUPROFEN 800 MG PO TABS: 800.0000 mg | ORAL_TABLET | Freq: Three times a day (TID) | ORAL | Status: AC | PRN

## 2013-04-12 NOTE — Discharge Summary (Signed)
Obstetric Discharge Summary Reason for Admission: induction of labor Prenatal Procedures: ultrasound Intrapartum Procedures: spontaneous vaginal delivery Postpartum Procedures: none Complications-Operative and Postpartum: 1 degree perineal laceration HGB  Date Value Range Status  01/21/2010 13.1  11.6 - 15.9 g/dL Final     Hemoglobin  Date Value Range Status  04/11/2013 10.1* 12.0 - 15.0 g/dL Final     HCT  Date Value Range Status  04/11/2013 28.7* 36.0 - 46.0 % Final  01/21/2010 37.0  34.8 - 46.6 % Final    Physical Exam:  General: alert and cooperative Lochia: appropriate Uterine Fundus: firm Incision: perineum intact DVT Evaluation: No evidence of DVT seen on physical exam. Negative Homan's sign. No cords or calf tenderness. No significant calf/ankle edema.  Discharge Diagnoses: Term Pregnancy-delivered  Discharge Information: Date: 04/12/2013 Activity: pelvic rest Diet: routine Medications: PNV, Ibuprofen and celexa Condition: stable Instructions: refer to practice specific booklet Discharge to: home   Newborn Data: Live born female  Birth Weight: 7 lb 2.1 oz (3235 g) APGAR: 6, 8  Home with mother.  Lasaundra Riche G 04/12/2013, 8:22 AM

## 2013-04-16 ENCOUNTER — Inpatient Hospital Stay (HOSPITAL_COMMUNITY): Admission: AD | Admit: 2013-04-16 | Payer: Self-pay | Source: Ambulatory Visit | Admitting: Obstetrics and Gynecology

## 2013-12-06 IMAGING — US US OB DETAIL+14 WK
1 series · 12 of 28 positions shown · non-contrast
Comparison: none

[Series 1: us ob detail+14 wk · 0.21mm/px · 12 of 95 slices shown]
[im 4/95]
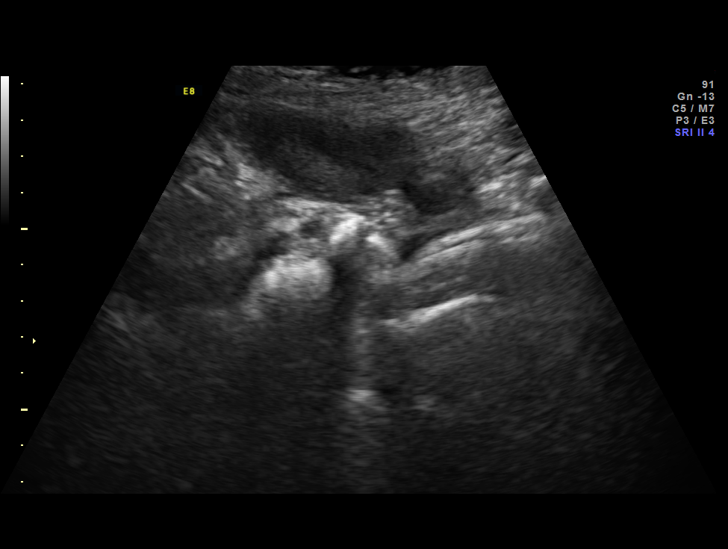
[im 11/95]
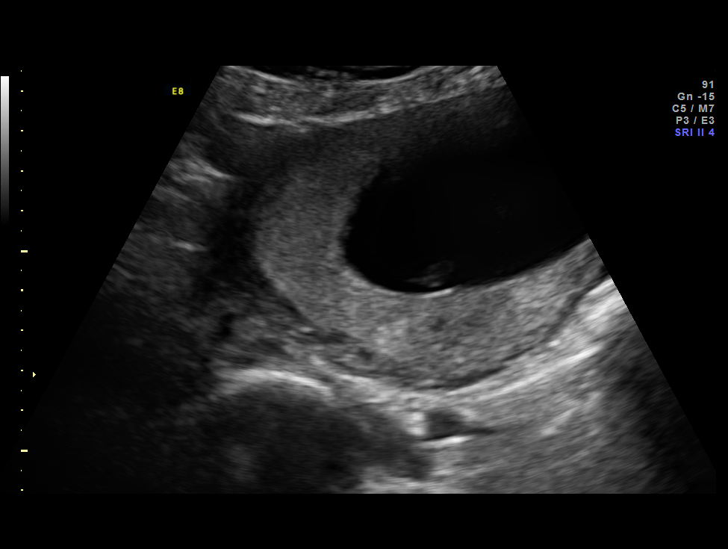
[im 18/95]
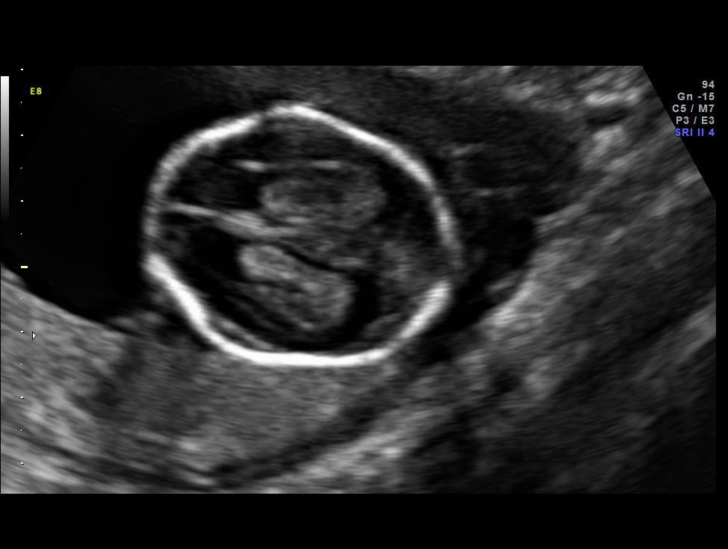
[im 28/95]
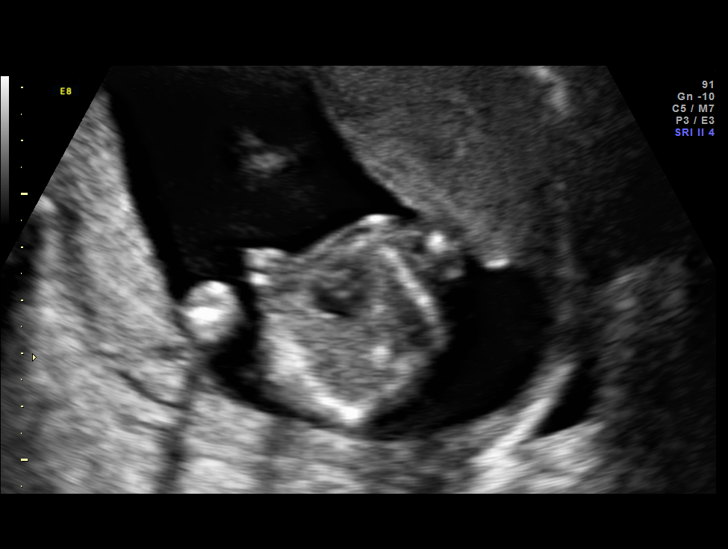
[im 35/95]
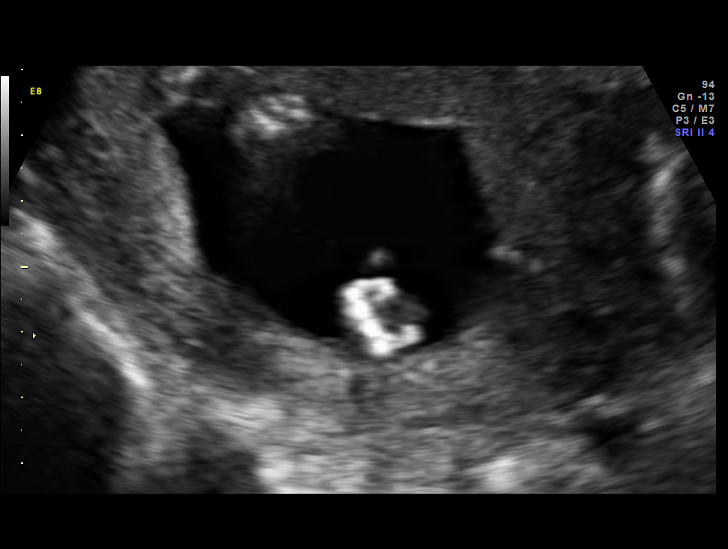
[im 42/95]
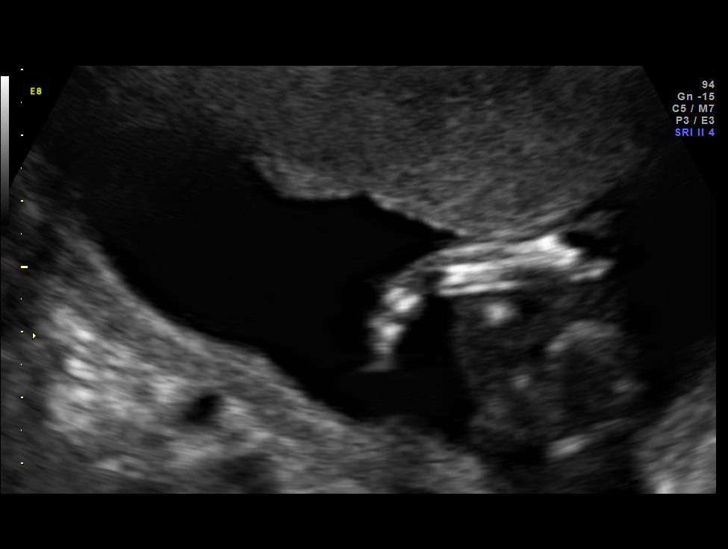
[im 53/95]
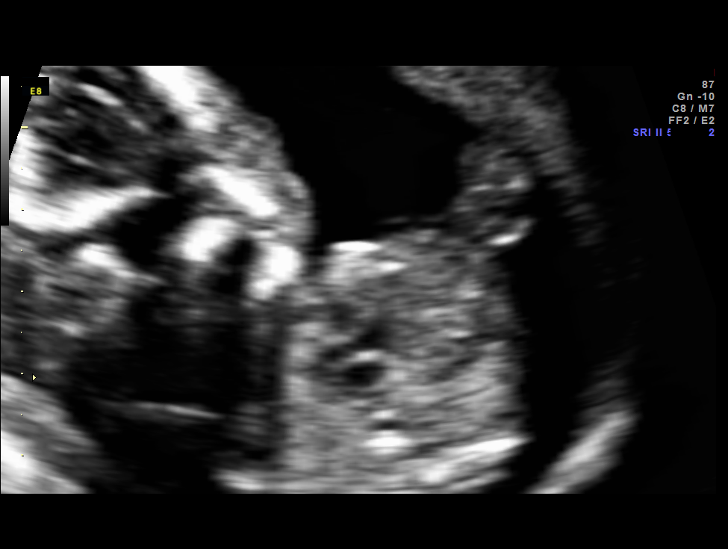
[im 60/95]
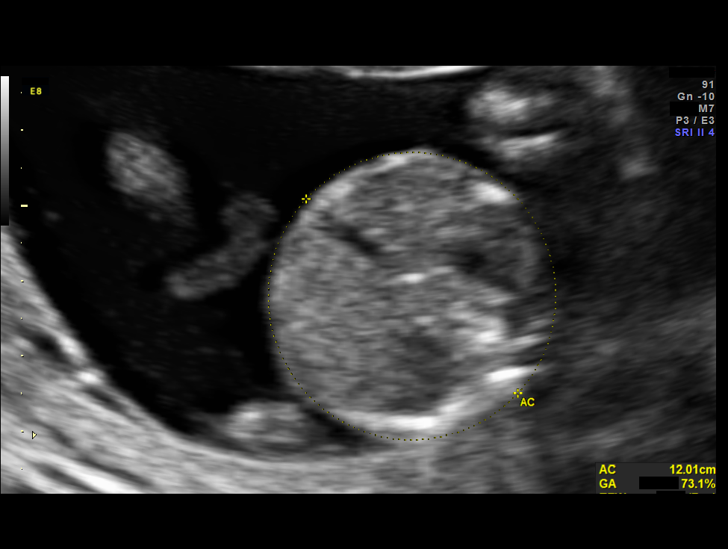
[im 67/95]
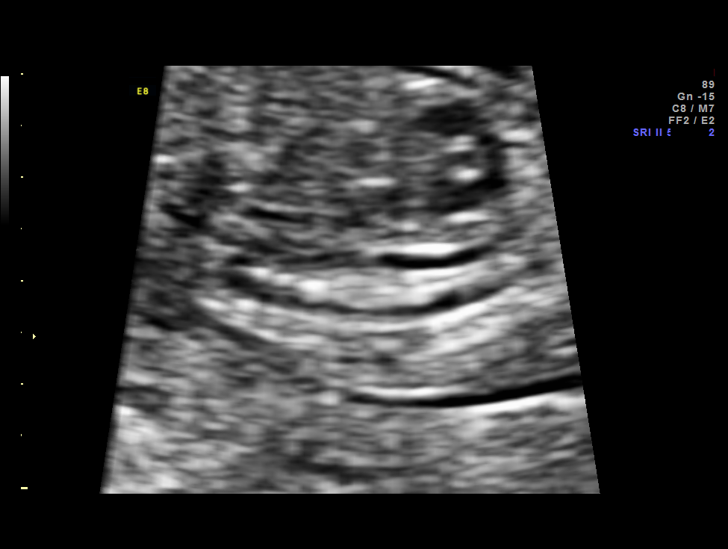
[im 77/95]
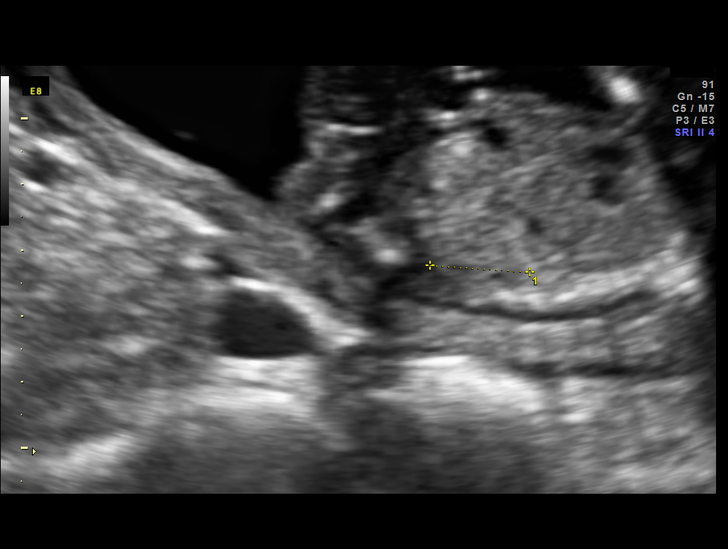
[im 84/95]
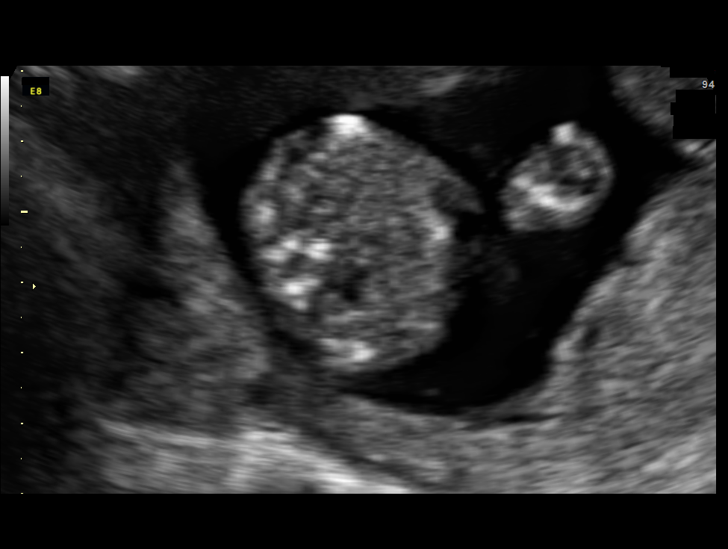
[im 91/95]
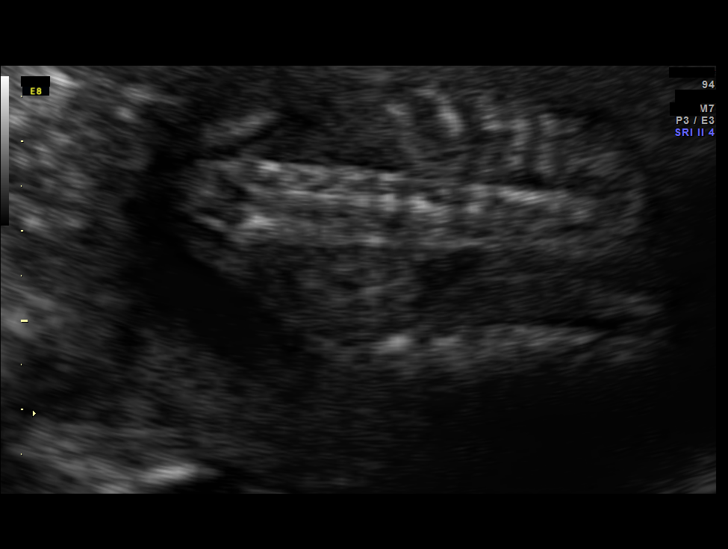

[12 of 28 positions shown; findings below may reference images not displayed]

OBSTETRICS REPORT
                      (Signed Final 11/07/2012 [DATE])

 Name:       EDISON CARLOS DEYBI                     Visit Date: 11/07/2012 [DATE]

Service(s) Provided

 US OB DETAIL + 14 WK                                  76811.0
Indications

 Detailed fetal anatomic survey
 Advanced maternal age (AMA), Multigravida
 Normal UaterMB1
 Poor obstetric history: Previous preterm delivery
 Previous cesarean section
 Poor obstetric history-Recurrent (habitual) abortion
 (3 consecutive ab's)
 History of genetic / anatomic abnormality -
 personal or family
Fetal Evaluation

 Num Of Fetuses:    1
 Fetal Heart Rate:  169                          bpm
 Cardiac Activity:  Observed
 Presentation:      Variable
 Placenta:          Posterior, above cervical
                    os
 P. Cord            Visualized
 Insertion:

 Amniotic Fluid
 AFI FV:      Subjectively within normal limits
                                             Larg Pckt:     5.6  cm
Biometry

 BPD:     38.1  mm     G. Age:  17w 4d                CI:         76.2   70 - 86
 OFD:       50  mm                                    FL/HC:      16.3   14.6 -

 HC:     140.9  mm     G. Age:  17w 3d       61  %    HC/AC:      1.15   1.07 -

 AC:     122.4  mm     G. Age:  17w 6d       78  %    FL/BPD:
 FL:        23  mm     G. Age:  16w 6d       42  %    FL/AC:      18.8   20 - 24
 HUM:     23.6  mm     G. Age:  17w 2d       64  %
 CER:     17.3  mm     G. Age:  17w 2d       57  %
 NFT:      2.9  mm

 Est. FW:     194  gm      0 lb 7 oz     65  %
Gestational Age

 LMP:           17w 0d        Date:  07/11/12                 EDD:   04/17/13
 U/S Today:     17w 3d                                        EDD:   04/14/13
 Best:          17w 0d     Det. By:  LMP  (07/11/12)          EDD:   04/17/13
Anatomy

 Cranium:          Appears normal         Aortic Arch:      Appears normal
 Fetal Cavum:      Appears normal         Ductal Arch:      Appears normal
 Ventricles:       Appears normal         Diaphragm:        Appears normal
 Choroid Plexus:   Appears normal         Stomach:          Appears normal, left
                                                            sided
 Cerebellum:       Appears normal         Abdomen:          Appears normal
 Posterior Fossa:  Appears normal         Abdominal Wall:   Appears nml (cord
                                                            insert, abd wall)
 Nuchal Fold:      Appears normal         Cord Vessels:     Appears normal (3
                                                            vessel cord)
 Face:             Appears normal         Kidneys:          Appear normal
                   (orbits and profile)
 Lips:             Appears normal         Bladder:          Appears normal
 Heart:            Appears normal         Spine:            Limited views
                   (4CH, axis, and                          appear normal
                   situs)
 RVOT:             Appears normal         Lower             Appears normal
                                          Extremities:
 LVOT:             Appears normal         Upper             Appears normal
                                          Extremities:

 Other:  Fetus appears to be a female. Heels and 5th digit appear normal.
Targeted Anatomy

 Fetal Central Nervous System
 Cisterna Magna:
Cervix Uterus Adnexa

 Cervical Length:    3.9      cm

 Cervix:       Normal appearance by transabdominal scan. Appears
               closed, without funnelling.
 Left Ovary:    Not visualized. No adnexal mass visualized.
 Right Ovary:   Not visualized. No adnexal mass visualized.
Comments

 Ms Mhbolat was seen today due to advanced maternal age.  See
 separate note from Genetics counselor.  The patient
 previously underwent NIPT (cell free fetal DNA) that was low
 risk for aneuploidy.  She also reports that a previous child
 was diagnosed with a subtle congenital heart defect.  The
 couple was offerred and declined a fetal echo wtih Peds
 cardiology.
Impression

 Single IUP at 17 0/7 weeks
 Normal detailed fetal anatomy
 No markers associated with aneuploidy noted
 Normal amniotic fluid volume
Recommendations

 Recommend follow-up ultrasound examination in 6 weeks.

 Thank you for sharing in the care of Ms. EDISON CARLOS DEYBI with
 questions or concerns.

## 2014-06-17 ENCOUNTER — Encounter (HOSPITAL_COMMUNITY): Payer: Self-pay

## 2019-04-25 ENCOUNTER — Other Ambulatory Visit (HOSPITAL_COMMUNITY): Payer: Self-pay | Admitting: Internal Medicine

## 2019-04-25 DIAGNOSIS — R9431 Abnormal electrocardiogram [ECG] [EKG]: Secondary | ICD-10-CM

## 2019-04-30 ENCOUNTER — Ambulatory Visit (HOSPITAL_COMMUNITY): Payer: BC Managed Care – PPO | Attending: Cardiology

## 2019-04-30 ENCOUNTER — Other Ambulatory Visit: Payer: Self-pay

## 2019-04-30 DIAGNOSIS — R9431 Abnormal electrocardiogram [ECG] [EKG]: Secondary | ICD-10-CM

## 2019-05-10 ENCOUNTER — Ambulatory Visit (INDEPENDENT_AMBULATORY_CARE_PROVIDER_SITE_OTHER): Payer: BC Managed Care – PPO | Admitting: Cardiovascular Disease

## 2019-05-10 ENCOUNTER — Encounter: Payer: Self-pay | Admitting: Cardiovascular Disease

## 2019-05-10 ENCOUNTER — Other Ambulatory Visit: Payer: Self-pay

## 2019-05-10 DIAGNOSIS — R9431 Abnormal electrocardiogram [ECG] [EKG]: Secondary | ICD-10-CM

## 2019-05-10 DIAGNOSIS — R0789 Other chest pain: Secondary | ICD-10-CM | POA: Diagnosis not present

## 2019-05-10 DIAGNOSIS — E782 Mixed hyperlipidemia: Secondary | ICD-10-CM | POA: Diagnosis not present

## 2019-05-10 DIAGNOSIS — R079 Chest pain, unspecified: Secondary | ICD-10-CM | POA: Diagnosis not present

## 2019-05-10 DIAGNOSIS — E785 Hyperlipidemia, unspecified: Secondary | ICD-10-CM | POA: Insufficient documentation

## 2019-05-10 MED ORDER — METOPROLOL TARTRATE 100 MG PO TABS: 100.0000 mg | ORAL_TABLET | Freq: Once | ORAL | 0 refills | Status: AC

## 2019-05-10 MED ORDER — ATORVASTATIN CALCIUM 10 MG PO TABS: 10.0000 mg | ORAL_TABLET | Freq: Every day | ORAL | 3 refills | Status: DC

## 2019-05-10 NOTE — Patient Instructions (Addendum)
Your cardiac CT will be scheduled at one of the below locations:   [facility] [address] [place], [place] [address] [phone]  OR  [facility] [address] [place], [place] [address] [phone]  If scheduled at [facility], please arrive at the [facility] main entrance of [facility] 30-45 minutes prior to test start time. Proceed to the [facility] Radiology Department (first floor) to check-in and test prep.  If scheduled at [facility], please arrive 15 mins early for check-in and test prep.  Please follow these instructions carefully (unless otherwise directed):  Lab work: Your physician recommends that you return for lab work 3-7 days prior to your coronary CTA  If you have labs (blood work) drawn today and your tests are completely normal, you will receive your results only by: Marland Kitchen MyChart Message (if you have MyChart) OR . A paper copy in the mail If you have any lab test that is abnormal or we need to change your treatment, we will call you to review the results.  On the Night Before the Test: . Be sure to Drink plenty of water. . Do not consume any caffeinated/decaffeinated beverages or chocolate 12 hours prior to your test. . Do not take any antihistamines 12 hours prior to your test.  On the Day of the Test: . Drink plenty of water. Do not drink any water within one hour of the test. . Do not eat any food 4 hours prior to the test. . You may take your regular medications prior to the test.  . Take metoprolol (Lopressor) 100 MG two hours prior to test. . FEMALES- please wear underwire-free bra if available        After the Test: . Drink plenty of water. . After receiving IV contrast, you may experience a mild flushed feeling. This is normal. . On occasion, you may experience a mild rash up to 24 hours after the test. This is not dangerous. If this  occurs, you can take Benadryl 25 mg and increase your fluid intake. . If you experience trouble breathing, this can be serious. If it is severe call 911 IMMEDIATELY. If it is mild, please call our office. . If you take any of these medications: Glipizide/Metformin, Avandament, Glucavance, please do not take 48 hours after completing test unless otherwise instructed.    Please contact the cardiac imaging nurse navigator should you have any questions/concerns Rockwell Alexandria, RN Navigator Cardiac Imaging [facility] [phone] Office  [phone] Cell    _____________________________________________________________________ Medication Instructions:  Your physician has recommended you make the following change in your medication:   START ATORVASTATIN 10 MG BY MOUTH DAILY  If you need a refill on your cardiac medications before your next appointment, please call your pharmacy.   Lab work: Your physician recommends that you return for lab work IN 3 MONTHS: LIPID AND LIVER PANELS If you have labs (blood work) drawn today and your tests are completely normal, you will receive your results only by: Marland Kitchen MyChart Message (if you have MyChart) OR . A paper copy in the mail If you have any lab test that is abnormal or we need to change your treatment, we will call you to review the results.  Testing/Procedures: none  Follow-Up: At [facility] you and your health needs are our priority.  As part of our continuing mission to provide you with exceptional heart care,  we have created designated Provider Care Teams.  These Care Teams include your primary Cardiologist (physician) and Advanced Practice Providers (APPs -  Physician Assistants and Nurse Practitioners) who all work together to provide you with the care you need, when you need it. . You will need a follow up appointment in 6 months with Dr. Quay Burow.  Please call our office 2 months in advance to schedule  this appointment.  You may see Dr. Gwenlyn Found or one of the following Advanced Practice Providers on your designated Care Team:   . Kerin Ransom, PA-C . Daleen Snook Kroeger, PA-C . Sande Rives, PA-C ____________________ . Almyra Deforest, PA-C . Fabian Sharp, PA-C . Jory Sims, DNP . Rosaria Ferries, PA-C

## 2019-05-10 NOTE — Assessment & Plan Note (Signed)
Hyperlipidemia with lipid profile performed by her PCP 04/19/2019 revealing total cholesterol 237, LDL 141 and HDL 76.  Given her potential family history of heart disease I am going to start her on low-dose atorvastatin 10 mg a day and we will recheck a lipid liver profile in 3 months

## 2019-05-10 NOTE — Assessment & Plan Note (Signed)
EKG shows sinus rhythm with narrow inferolateral Q waves although 2D echocardiogram performed 04/30/2019 was entirely normal without focal wall motion abnormalities.  I suspect that this is a normal variant.

## 2019-05-10 NOTE — Progress Notes (Signed)
05/10/2019 Heavin A Decoster   [birth year]  [id number]  Primary Physician Molli Posey, MD Primary Cardiologist: Lorretta Harp MD Garret Reddish, [place], [place]  HPI:  Deanna Martinez is a 52 y.o. widowed Caucasian female mother of 5 children who works as a Pharmacist, [employer] in [place].  She was referred by Dr. Deland Pretty, her PCP, for cardiovascular dilation because of an abnormal EKG.  Her only risk factor includes mild hyperlipidemia with LDL 140 obtained by her primary care physician 04/19/2019 although she does have an elevated HDL as well.  She does not smoke nor she diabetic.  She is never had a heart attack or stroke.  Her mother did die suddenly raising the question of ischemic heart disease.  An EKG done by Dr. Concha Pyo shows narrow inferolateral Q waves although a 2D echo performed 04/30/2019 was entirely normal without focal wall motion abnormalities.  She does complain of substernal chest pain, sent somewhat atypical, beginning several months ago and occurring at least twice a week lasting minutes at a time.  These occur randomly.  There are no other associated symptoms.  It does not radiate.   Current Meds  Medication Sig  . Apremilast (OTEZLA) 30 MG TABS Take 1 tablet by mouth 2 (two) times daily.  . citalopram (CELEXA) 40 MG tablet Take 40 mg by mouth daily.  . citalopram (CELEXA) 40 MG tablet Take 20 mg by mouth daily.  . diclofenac (VOLTAREN) 75 MG EC tablet Take 75 mg by mouth 2 (two) times daily.  Marland Kitchen ibuprofen (ADVIL,MOTRIN) 800 MG tablet Take 1 tablet (800 mg total) by mouth every 8 (eight) hours as needed.  . Ixekizumab (TALTZ) 80 MG/ML SOAJ Inject 1 Dose into the skin every 14 (fourteen) days.  . Prenatal Vit-Fe Fumarate-FA (PRENATAL MULTIVITAMIN) TABS tablet Take 1 tablet by mouth daily at 12 noon.     No Known Allergies  Social History   Socioeconomic History  . Marital status: Divorced    Spouse name: Not on file  . Number of children: Not on file  .  Years of education: Not on file  . Highest education level: Not on file  Occupational History  . Not on file  Social Needs  . Financial resource strain: Not on file  . Food insecurity    Worry: Not on file    Inability: Not on file  . Transportation needs    Medical: Not on file    Non-medical: Not on file  Tobacco Use  . Smoking status: Never Smoker  . Smokeless tobacco: Never Used  Substance and Sexual Activity  . Alcohol use: No  . Drug use: No  . Sexual activity: Yes    Birth control/protection: None    Comment: tubal if CS  Lifestyle  . Physical activity    Days per week: Not on file    Minutes per session: Not on file  . Stress: Not on file  Relationships  . Social Herbalist on phone: Not on file    Gets together: Not on file    Attends religious service: Not on file    Active member of club or organization: Not on file    Attends meetings of clubs or organizations: Not on file    Relationship status: Not on file  . Intimate partner violence    Fear of current or ex partner: Not on file    Emotionally abused: Not on file  Physically abused: Not on file    Forced sexual activity: Not on file  Other Topics Concern  . Not on file  Social History Narrative  . Not on file     Review of Systems: General: negative for chills, fever, night sweats or weight changes.  Cardiovascular: negative for chest pain, dyspnea on exertion, edema, orthopnea, palpitations, paroxysmal nocturnal dyspnea or shortness of breath Dermatological: negative for rash Respiratory: negative for cough or wheezing Urologic: negative for hematuria Abdominal: negative for nausea, vomiting, diarrhea, bright red blood per rectum, melena, or hematemesis Neurologic: negative for visual changes, syncope, or dizziness All other systems reviewed and are otherwise negative except as noted above.    Blood pressure 123/78, pulse 72, height 5\' 6"  (1.676 m), weight 154 lb (69.9 kg), SpO2 98  %, unknown if currently breastfeeding.  General appearance: alert and no distress Neck: no adenopathy, no carotid bruit, no JVD, supple, symmetrical, trachea midline and thyroid not enlarged, symmetric, no tenderness/mass/nodules Lungs: clear to auscultation bilaterally Heart: regular rate and rhythm, S1, S2 normal, no murmur, click, rub or gallop Extremities: extremities normal, atraumatic, no cyanosis or edema Pulses: 2+ and symmetric Skin: Skin color, texture, turgor normal. No rashes or lesions Neurologic: Alert and oriented X 3, normal strength and tone. Normal symmetric reflexes. Normal coordination and gait  EKG sinus rhythm at 72 with inferolateral Q waves.  I personally reviewed this EKG.  ASSESSMENT AND PLAN:   Hyperlipidemia Hyperlipidemia with lipid profile performed by her PCP 04/19/2019 revealing total cholesterol 237, LDL 141 and HDL 76.  Given her potential family history of heart disease I am going to start her on low-dose atorvastatin 10 mg a day and we will recheck a lipid liver profile in 3 months  Nonspecific abnormal electrocardiogram (ECG) (EKG) EKG shows sinus rhythm with narrow inferolateral Q waves although 2D echocardiogram performed 04/30/2019 was entirely normal without focal wall motion abnormalities.  I suspect that this is a normal variant.  Chest pain of uncertain etiology Ms. 05/02/2019 complains of atypical chest pain which is substernal, lasting minutes at a time without radiation and beginning several months ago, occurring at least 2 days a week.  It does not necessarily occur with any activities but is fairly random.  Given her mother's history of sudden cardiac death it certainly possible that she has a family history of ischemic heart disease.  I am going to get a coronary CTA to further evaluate.      Keitha Butte MD FACP,FACC,FAHA, [facility] 05/10/2019 4:26 PM

## 2019-05-10 NOTE — Assessment & Plan Note (Signed)
Ms. Deanna Martinez complains of atypical chest pain which is substernal, lasting minutes at a time without radiation and beginning several months ago, occurring at least 2 days a week.  It does not necessarily occur with any activities but is fairly random.  Given her mother's history of sudden cardiac death it certainly possible that she has a family history of ischemic heart disease.  I am going to get a coronary CTA to further evaluate.

## 2019-11-15 ENCOUNTER — Telehealth: Payer: Self-pay | Admitting: Cardiovascular Disease

## 2019-11-15 NOTE — Telephone Encounter (Signed)
Patient stated that she would call back to schedule 6 month follow up with Dr. Allyson Sabal.

## 2020-05-12 ENCOUNTER — Other Ambulatory Visit: Payer: Self-pay | Admitting: Cardiovascular Disease

## 2020-05-26 ENCOUNTER — Other Ambulatory Visit: Payer: Self-pay | Admitting: Cardiovascular Disease

## 2021-05-14 ENCOUNTER — Other Ambulatory Visit: Payer: Self-pay | Admitting: Internal Medicine

## 2021-05-14 DIAGNOSIS — R9431 Abnormal electrocardiogram [ECG] [EKG]: Secondary | ICD-10-CM

## 2021-07-08 ENCOUNTER — Ambulatory Visit
Admission: RE | Admit: 2021-07-08 | Discharge: 2021-07-08 | Disposition: A | Discharge status: Home / Self Care | Payer: BC Managed Care – PPO | Source: Ambulatory Visit | Attending: Internal Medicine | Admitting: Internal Medicine

## 2021-07-08 DIAGNOSIS — R9431 Abnormal electrocardiogram [ECG] [EKG]: Secondary | ICD-10-CM

## 2022-08-06 IMAGING — CT CT CARDIAC CORONARY ARTERY CALCIUM SCORE
4 series · 12 of 20 positions shown, 13 images · non-contrast
Comparison: None.

CLINICAL DATA: 54-year-old Caucasian female with family history of
heart disease.

EXAM:
CT CARDIAC CORONARY ARTERY CALCIUM SCORE
TECHNIQUE: Non-contrast imaging through the heart was performed using
prospective ECG gating. Image post processing was performed on an
independent workstation, allowing for quantitative analysis of the
heart and coronary arteries. Note that this exam targets the heart
and the chest was not imaged in its entirety.

[Series 2: calcium scoring 2.00 qr36 bestdiast 70% hrt calciu · axial · 0.37mm/px · z∈[+1618,+1698]mm · 3 of 80 slices shown, 4 images]
[im 20/80  vessel]
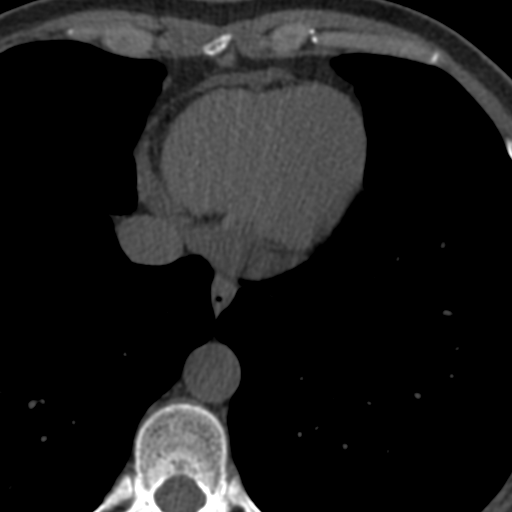
[im 20/80  lung]
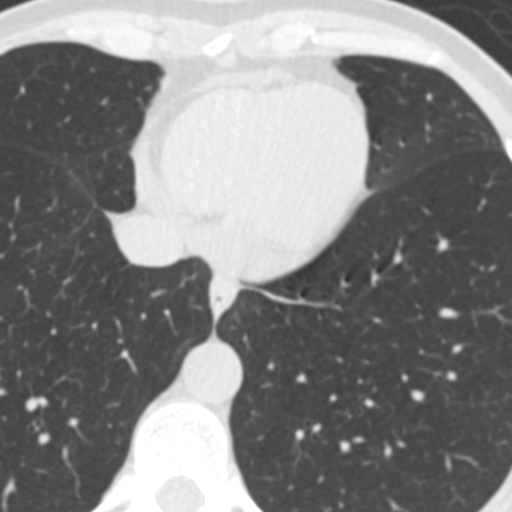
[im 40/80  vessel]
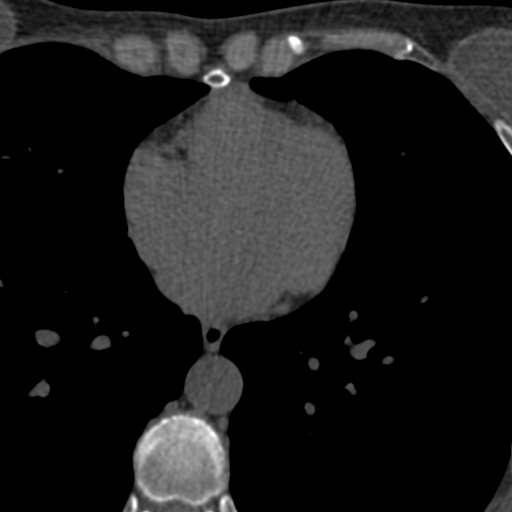
[im 60/80  vessel]
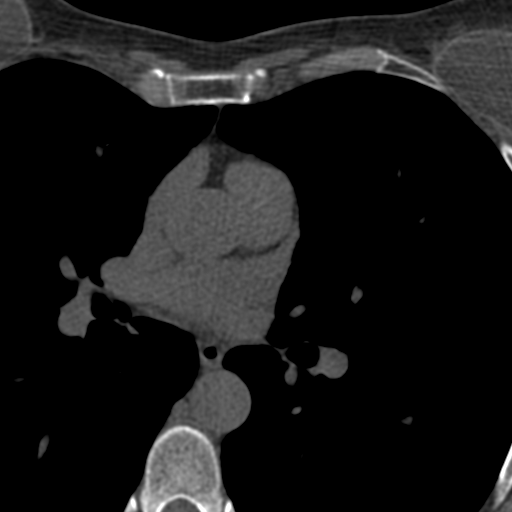

[Series 3: calcium scoring 2.00 br40 bestdiast 70% axial · axial · 0.37mm/px · z∈[+1618,+1698]mm · 3 of 80 slices shown (1 of 2)]
[im 20/80  vessel]
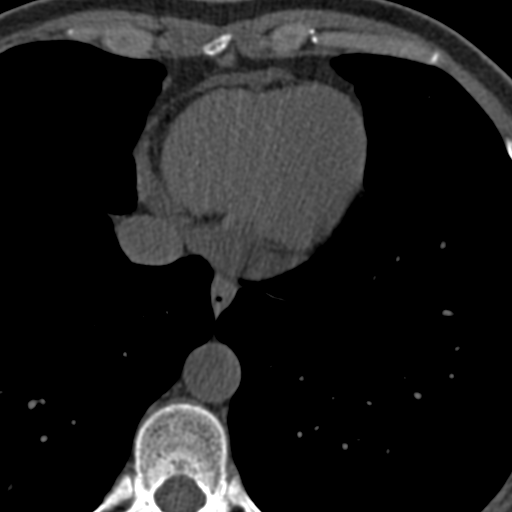
[im 40/80  vessel]
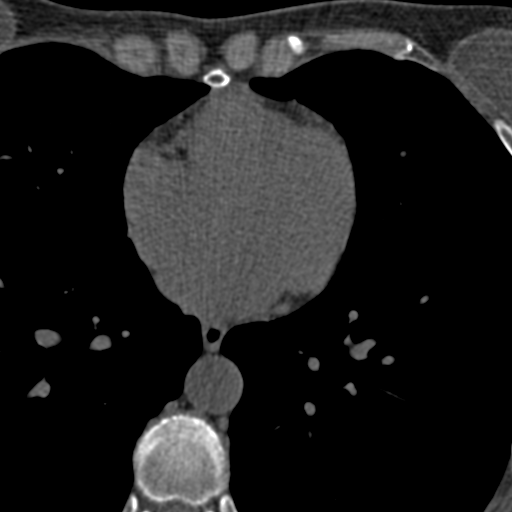
[im 60/80  vessel]
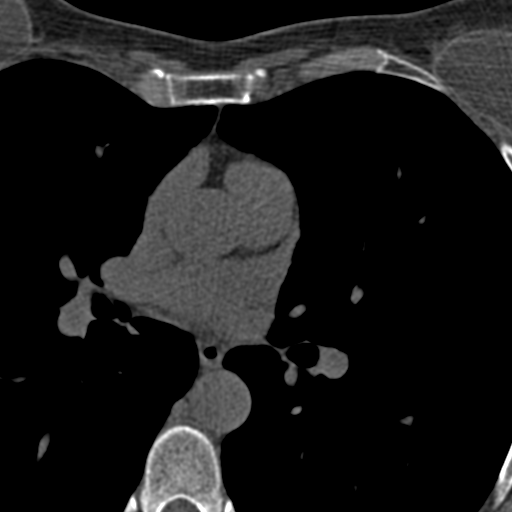

[Series 12: calcium scoring 2.00 br40 bestdiast 70% axial · axial · 0.51mm/px · z∈[+1618,+1698]mm · 3 of 80 slices shown (2 of 2)]
[im 20/80  vessel]
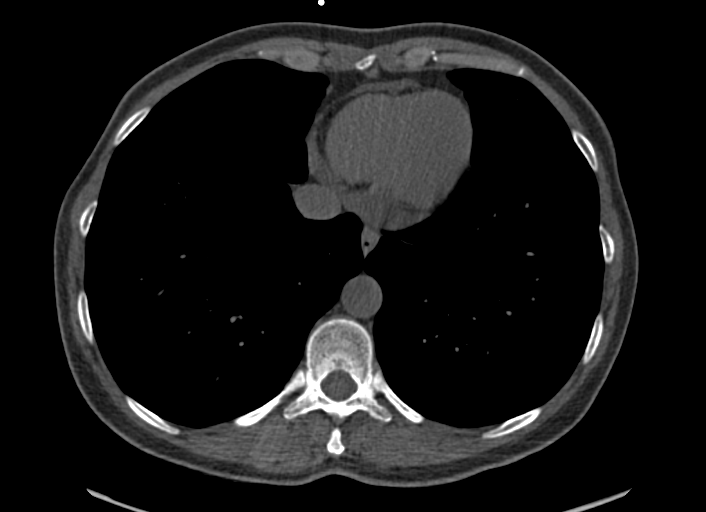
[im 40/80  vessel]
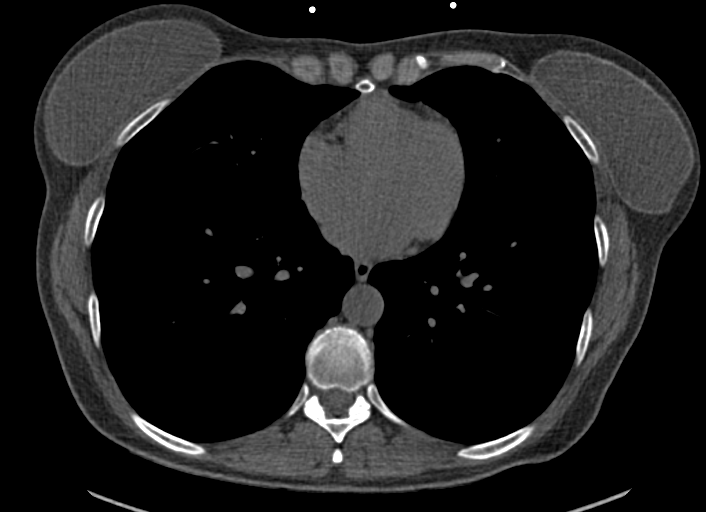
[im 60/80  vessel]
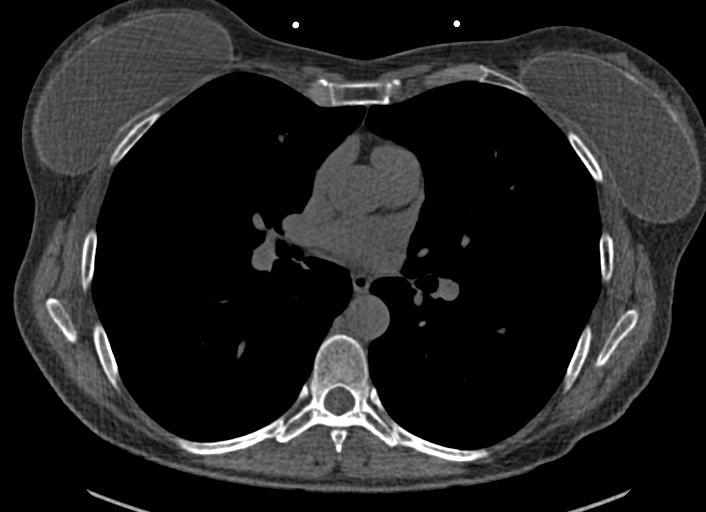

[Series 18: calcium scoring 2.00 br60 bestdiast 70% lungs · axial · 0.49mm/px · z∈[+1618,+1698]mm · 3 of 80 slices shown]
[im 20/80  vessel]
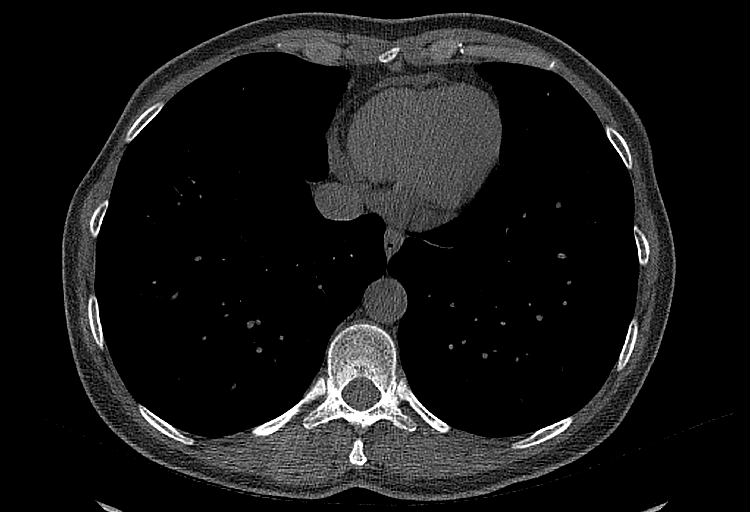
[im 40/80  vessel]
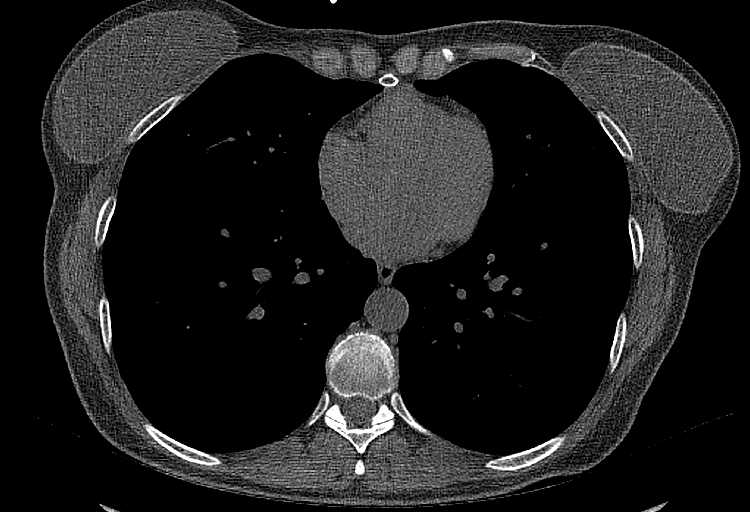
[im 60/80  vessel]
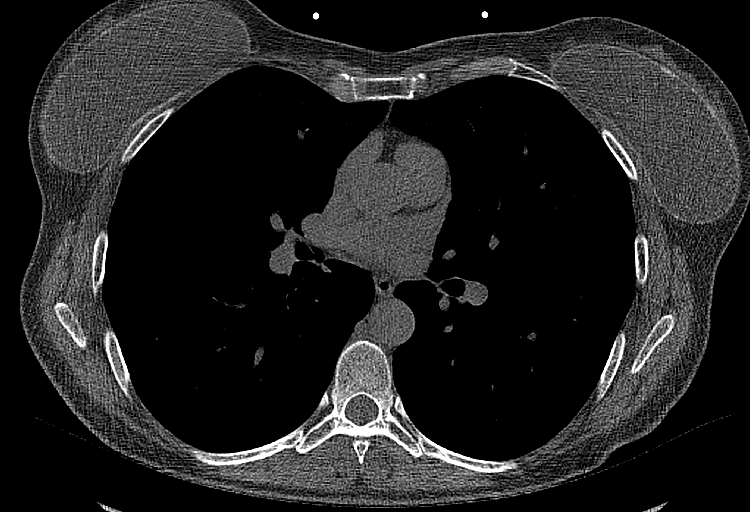

[12 of 20 positions shown; findings below may reference images not displayed]

FINDINGS: CORONARY CALCIUM SCORES:

Left Main: 0

LAD: 0

LCx: 0

RCA: 0

Total Agatston Score: 0

[HOSPITAL] percentile: 0

AORTA MEASUREMENTS:

Ascending Aorta: 26 mm

Descending Aorta: 21 mm

OTHER FINDINGS:


## 2023-09-12 ENCOUNTER — Other Ambulatory Visit (HOSPITAL_COMMUNITY): Payer: Self-pay

## 2023-09-12 MED ORDER — AMPHETAMINE-DEXTROAMPHET ER 10 MG PO CP24
10.0000 mg | ORAL_CAPSULE | Freq: Every morning | ORAL | 0 refills | Status: DC
  Filled 2023-09-12: qty 30, 30d supply, fill #0

## 2023-09-12 MED ORDER — AMPHETAMINE-DEXTROAMPHETAMINE 5 MG PO TABS
5.0000 mg | ORAL_TABLET | Freq: Two times a day (BID) | ORAL | 0 refills | Status: DC
  Filled 2023-09-12: qty 60, 30d supply, fill #0

## 2023-09-13 ENCOUNTER — Other Ambulatory Visit (HOSPITAL_COMMUNITY): Payer: Self-pay

## 2023-09-14 ENCOUNTER — Other Ambulatory Visit (HOSPITAL_COMMUNITY): Payer: Self-pay

## 2023-10-10 ENCOUNTER — Other Ambulatory Visit (HOSPITAL_COMMUNITY): Payer: Self-pay

## 2023-10-10 MED ORDER — AMPHETAMINE-DEXTROAMPHET ER 10 MG PO CP24
10.0000 mg | ORAL_CAPSULE | Freq: Every morning | ORAL | 0 refills | Status: DC
  Filled 2023-10-13: qty 30, 30d supply, fill #0

## 2023-10-10 MED ORDER — AMPHETAMINE-DEXTROAMPHETAMINE 5 MG PO TABS
5.0000 mg | ORAL_TABLET | Freq: Two times a day (BID) | ORAL | 0 refills | Status: DC
  Filled 2023-10-13: qty 60, 30d supply, fill #0

## 2023-10-13 ENCOUNTER — Other Ambulatory Visit (HOSPITAL_COMMUNITY): Payer: Self-pay

## 2023-10-14 ENCOUNTER — Other Ambulatory Visit (HOSPITAL_COMMUNITY): Payer: Self-pay

## 2023-11-14 ENCOUNTER — Other Ambulatory Visit (HOSPITAL_COMMUNITY): Payer: Self-pay

## 2023-11-14 MED ORDER — AMPHETAMINE-DEXTROAMPHET ER 10 MG PO CP24
10.0000 mg | ORAL_CAPSULE | Freq: Every morning | ORAL | 0 refills | Status: DC
  Filled 2023-11-14: qty 30, 30d supply, fill #0

## 2023-11-14 MED ORDER — AMPHETAMINE-DEXTROAMPHETAMINE 5 MG PO TABS
5.0000 mg | ORAL_TABLET | Freq: Two times a day (BID) | ORAL | 0 refills | Status: DC
  Filled 2023-11-14: qty 20, 10d supply, fill #0
  Filled 2023-11-14: qty 40, 20d supply, fill #0

## 2023-11-18 ENCOUNTER — Other Ambulatory Visit (HOSPITAL_COMMUNITY): Payer: Self-pay

## 2023-12-16 ENCOUNTER — Other Ambulatory Visit (HOSPITAL_COMMUNITY): Payer: Self-pay

## 2023-12-16 MED ORDER — AMPHETAMINE-DEXTROAMPHET ER 10 MG PO CP24
10.0000 mg | ORAL_CAPSULE | Freq: Every morning | ORAL | 0 refills | Status: DC
  Filled 2023-12-16: qty 30, 30d supply, fill #0

## 2023-12-16 MED ORDER — AMPHETAMINE-DEXTROAMPHETAMINE 5 MG PO TABS
5.0000 mg | ORAL_TABLET | Freq: Two times a day (BID) | ORAL | 0 refills | Status: DC
  Filled 2023-12-16: qty 60, 30d supply, fill #0

## 2023-12-27 ENCOUNTER — Other Ambulatory Visit (HOSPITAL_COMMUNITY): Payer: Self-pay

## 2024-02-02 ENCOUNTER — Other Ambulatory Visit: Payer: Self-pay

## 2024-02-02 ENCOUNTER — Other Ambulatory Visit (HOSPITAL_COMMUNITY): Payer: Self-pay

## 2024-02-02 MED ORDER — AMPHETAMINE-DEXTROAMPHET ER 10 MG PO CP24
10.0000 mg | ORAL_CAPSULE | Freq: Every morning | ORAL | 0 refills | Status: DC
  Filled 2024-02-02: qty 30, 30d supply, fill #0

## 2024-02-02 MED ORDER — AMPHETAMINE-DEXTROAMPHETAMINE 5 MG PO TABS
5.0000 mg | ORAL_TABLET | Freq: Two times a day (BID) | ORAL | 0 refills | Status: DC
  Filled 2024-02-02: qty 60, 30d supply, fill #0

## 2024-02-10 ENCOUNTER — Other Ambulatory Visit (HOSPITAL_COMMUNITY): Payer: Self-pay

## 2024-03-22 ENCOUNTER — Other Ambulatory Visit (HOSPITAL_COMMUNITY): Payer: Self-pay

## 2024-03-22 MED ORDER — AMPHETAMINE-DEXTROAMPHET ER 10 MG PO CP24
10.0000 mg | ORAL_CAPSULE | Freq: Every morning | ORAL | 0 refills | Status: DC
  Filled 2024-03-22: qty 30, 30d supply, fill #0

## 2024-03-22 MED ORDER — AMPHETAMINE-DEXTROAMPHETAMINE 5 MG PO TABS
5.0000 mg | ORAL_TABLET | Freq: Two times a day (BID) | ORAL | 0 refills | Status: DC
  Filled 2024-03-22: qty 60, 30d supply, fill #0

## 2024-04-25 ENCOUNTER — Other Ambulatory Visit (HOSPITAL_COMMUNITY): Payer: Self-pay

## 2024-04-25 MED ORDER — AMPHETAMINE-DEXTROAMPHET ER 10 MG PO CP24
10.0000 mg | ORAL_CAPSULE | Freq: Every morning | ORAL | 0 refills | Status: DC
  Filled 2024-04-25 – 2024-05-07 (×2): qty 30, 30d supply, fill #0

## 2024-04-25 MED ORDER — AMPHETAMINE-DEXTROAMPHETAMINE 5 MG PO TABS
5.0000 mg | ORAL_TABLET | Freq: Two times a day (BID) | ORAL | 0 refills | Status: DC
  Filled 2024-04-25 – 2024-05-07 (×2): qty 60, 30d supply, fill #0

## 2024-05-04 ENCOUNTER — Other Ambulatory Visit (HOSPITAL_COMMUNITY): Payer: Self-pay

## 2024-05-07 ENCOUNTER — Other Ambulatory Visit (HOSPITAL_COMMUNITY): Payer: Self-pay

## 2024-06-18 ENCOUNTER — Other Ambulatory Visit: Payer: Self-pay

## 2024-06-18 ENCOUNTER — Other Ambulatory Visit (HOSPITAL_BASED_OUTPATIENT_CLINIC_OR_DEPARTMENT_OTHER): Payer: Self-pay | Admitting: Internal Medicine

## 2024-06-18 ENCOUNTER — Other Ambulatory Visit (HOSPITAL_COMMUNITY): Payer: Self-pay

## 2024-06-18 DIAGNOSIS — E7800 Pure hypercholesterolemia, unspecified: Secondary | ICD-10-CM

## 2024-06-18 MED ORDER — AMPHETAMINE-DEXTROAMPHETAMINE 5 MG PO TABS
5.0000 mg | ORAL_TABLET | Freq: Two times a day (BID) | ORAL | 0 refills | Status: DC
  Filled 2024-06-18: qty 120, 60d supply, fill #0
  Filled 2024-06-18: qty 60, 30d supply, fill #0

## 2024-06-18 MED ORDER — AMPHETAMINE-DEXTROAMPHET ER 10 MG PO CP24
10.0000 mg | ORAL_CAPSULE | Freq: Every morning | ORAL | 0 refills | Status: DC
  Filled 2024-06-18: qty 13, 13d supply, fill #0
  Filled 2024-06-18: qty 77, 77d supply, fill #0

## 2024-06-21 ENCOUNTER — Other Ambulatory Visit (HOSPITAL_COMMUNITY): Payer: Self-pay

## 2024-07-02 ENCOUNTER — Ambulatory Visit (HOSPITAL_COMMUNITY)
Admission: RE | Admit: 2024-07-02 | Discharge: 2024-07-02 | Disposition: A | Discharge status: Home / Self Care | Payer: Self-pay | Source: Ambulatory Visit | Attending: Internal Medicine | Admitting: Internal Medicine

## 2024-07-02 DIAGNOSIS — E7800 Pure hypercholesterolemia, unspecified: Secondary | ICD-10-CM | POA: Insufficient documentation

## 2024-09-18 ENCOUNTER — Other Ambulatory Visit (HOSPITAL_COMMUNITY): Payer: Self-pay

## 2024-09-18 MED ORDER — AMPHETAMINE-DEXTROAMPHET ER 10 MG PO CP24
10.0000 mg | ORAL_CAPSULE | Freq: Every day | ORAL | 0 refills | Status: AC
  Filled 2024-09-18: qty 30, 30d supply, fill #0

## 2024-09-18 MED ORDER — AMPHETAMINE-DEXTROAMPHETAMINE 5 MG PO TABS
5.0000 mg | ORAL_TABLET | Freq: Two times a day (BID) | ORAL | 0 refills | Status: AC
  Filled 2024-09-18: qty 60, 30d supply, fill #0

## 2024-09-20 ENCOUNTER — Other Ambulatory Visit (HOSPITAL_COMMUNITY): Payer: Self-pay
